# Patient Record
Sex: Female | Born: 1940 | Race: White | Hispanic: No | Smoking: Never smoker
Health system: Southern US, Community
[De-identification: ages and names within clinical notes are randomized; demographics above are authoritative.]

## PROBLEM LIST (undated history)

## (undated) DIAGNOSIS — M1990 Unspecified osteoarthritis, unspecified site: Secondary | ICD-10-CM

## (undated) DIAGNOSIS — I4891 Unspecified atrial fibrillation: Secondary | ICD-10-CM

## (undated) DIAGNOSIS — I10 Essential (primary) hypertension: Secondary | ICD-10-CM

## (undated) DIAGNOSIS — N189 Chronic kidney disease, unspecified: Secondary | ICD-10-CM

## (undated) HISTORY — DX: Chronic kidney disease, unspecified: N18.9

## (undated) HISTORY — DX: Essential (primary) hypertension: I10

## (undated) HISTORY — DX: Unspecified osteoarthritis, unspecified site: M19.90

## (undated) HISTORY — PX: TOTAL HIP ARTHROPLASTY: SHX124

## (undated) HISTORY — DX: Unspecified atrial fibrillation: I48.91

## (undated) HISTORY — PX: JOINT REPLACEMENT: SHX530

## (undated) HISTORY — PX: VAGINAL HYSTERECTOMY: SUR661

## (undated) HISTORY — PX: ABDOMINAL HYSTERECTOMY: SHX81

## (undated) HISTORY — PX: TONSILLECTOMY: SUR1361

---

## 2013-06-20 ENCOUNTER — Emergency Department (INDEPENDENT_AMBULATORY_CARE_PROVIDER_SITE_OTHER)
Admission: EM | Admit: 2013-06-20 | Discharge: 2013-06-20 | Disposition: A | Discharge status: Home / Self Care | Payer: Medicare Other | Source: Home / Self Care | Attending: Family Medicine | Admitting: Family Medicine

## 2013-06-20 ENCOUNTER — Encounter: Payer: Self-pay | Admitting: Emergency Medicine

## 2013-06-20 DIAGNOSIS — J029 Acute pharyngitis, unspecified: Secondary | ICD-10-CM

## 2013-06-20 DIAGNOSIS — J069 Acute upper respiratory infection, unspecified: Secondary | ICD-10-CM

## 2013-06-20 LAB — POCT RAPID STREP A (OFFICE): Rapid Strep A Screen: NEGATIVE

## 2013-06-20 MED ORDER — AZITHROMYCIN 250 MG PO TABS: ORAL_TABLET | ORAL | Status: DC

## 2013-06-20 NOTE — ED Notes (Signed)
Sore throat, hoarse,  cough x 4 days

## 2013-06-20 NOTE — ED Provider Notes (Signed)
CSN: [id number]     Arrival date & time 06/20/13  1137 History   First MD Initiated Contact with Patient 06/20/13 1145     Chief Complaint  Patient presents with  . Sore Throat    HPI  URI Symptoms Onset: 2-3 days  Description: rhinorrhea, nasal congestion, cough, sore throat  Modifying factors:  None   Symptoms Nasal discharge: yes Fever: no Sore throat: yes Cough: yes Wheezing: no Ear pain: no GI symptoms: no Sick contacts: no  Red Flags  Stiff neck: no Dyspnea: no Rash: no Swallowing difficulty: no  Sinusitis Risk Factors Headache/face pain: no Double sickening: no tooth pain: no  Allergy Risk Factors Sneezing: no Itchy scratchy throat: no Seasonal symptoms: no  Flu Risk Factors Headache: no muscle aches: no severe fatigue: no   History reviewed. No pertinent past medical history. Past Surgical History  Procedure Laterality Date  . Tonsillectomy    . Joint replacement     No family history on file. History  Substance Use Topics  . Smoking status: Never Smoker   . Smokeless tobacco: Not on file  . Alcohol Use: No   OB History   Grav Para Term Preterm Abortions TAB SAB Ect Mult Living                 Review of Systems  All other systems reviewed and are negative.    Allergies  Review of patient's allergies indicates no known allergies.  Home Medications   Current Outpatient Rx  Name  Route  Sig  Dispense  Refill  . losartan-hydrochlorothiazide (HYZAAR) 100-12.5 MG per tablet   Oral   Take 1 tablet by mouth daily.         Marland Kitchen azithromycin (ZITHROMAX) 250 MG tablet      Take 2 tabs PO x 1 dose, then 1 tab PO QD x 4 days   6 tablet   0    BP 122/81  Pulse 91  Temp(Src) 98.1 F (36.7 C) (Oral)  Ht 5\' 2"  (1.575 m)  Wt 212 lb (96.163 kg)  BMI 38.77 kg/m2  SpO2 98% Physical Exam  Constitutional: She is oriented to person, place, and time. She appears well-developed and well-nourished.  HENT:  Head: Normocephalic and  atraumatic.  Right Ear: External ear normal.  Left Ear: External ear normal.  +nasal erythema, rhinorrhea bilaterally, + post oropharyngeal erythema    Eyes: Conjunctivae are normal. Pupils are equal, round, and reactive to light.  Neck: Normal range of motion. Neck supple.  Cardiovascular: Normal rate, regular rhythm and normal heart sounds.   Pulmonary/Chest: Effort normal and breath sounds normal.  Abdominal: Soft.  Musculoskeletal: Normal range of motion.  Neurological: She is alert and oriented to person, place, and time.  Skin: Skin is warm.    ED Course  Procedures (including critical care time) Labs Review Labs Reviewed - No data to display Imaging Review No results found.  EKG Interpretation    Date/Time:    Ventricular Rate:    PR Interval:    QRS Duration:   QT Interval:    QTC Calculation:   R Axis:     Text Interpretation:              MDM   1. Acute pharyngitis   2. URI (upper respiratory infection)    Likely viral source of sxs  Rapid strep negative.  Overall reassuring exam Discussed supportive care and infectious/resp red flags.  Zpak if sxs fail to improve/worsen Follow  up as needed.     The patient and/or caregiver has been counseled thoroughly with regard to treatment plan and/or medications prescribed including dosage, schedule, interactions, rationale for use, and possible side effects and they verbalize understanding. Diagnoses and expected course of recovery discussed and will return if not improved as expected or if the condition worsens. Patient and/or caregiver verbalized understanding.         Doree AlbeeSteven Jacqualynn Parco, MD 06/20/13 1204

## 2016-05-12 DIAGNOSIS — R944 Abnormal results of kidney function studies: Secondary | ICD-10-CM | POA: Insufficient documentation

## 2016-05-12 DIAGNOSIS — N1832 Chronic kidney disease, stage 3b: Secondary | ICD-10-CM | POA: Insufficient documentation

## 2016-06-15 DIAGNOSIS — N179 Acute kidney failure, unspecified: Secondary | ICD-10-CM | POA: Insufficient documentation

## 2017-03-28 DIAGNOSIS — N1832 Chronic kidney disease, stage 3b: Secondary | ICD-10-CM | POA: Insufficient documentation

## 2020-04-08 ENCOUNTER — Emergency Department
Admission: EM | Admit: 2020-04-08 | Discharge: 2020-04-08 | Disposition: A | Discharge status: Home / Self Care | Payer: Medicare Other | Source: Home / Self Care | Attending: Family Medicine | Admitting: Family Medicine

## 2020-04-08 ENCOUNTER — Other Ambulatory Visit: Payer: Self-pay

## 2020-04-08 DIAGNOSIS — J069 Acute upper respiratory infection, unspecified: Secondary | ICD-10-CM

## 2020-04-08 MED ORDER — AMOXICILLIN 875 MG PO TABS: 875.0000 mg | ORAL_TABLET | Freq: Two times a day (BID) | ORAL | 0 refills | Status: AC

## 2020-04-08 NOTE — ED Provider Notes (Signed)
[facility]    CSN: [id number] Arrival date & time: 04/08/20  1142      History   Chief Complaint Chief Complaint  Patient presents with  . Cough    HPI Courtney Stevenson is a 79 y.o. female.   Seven days ago patient developed sinus congestion, sore throat, hoarseness, mild headache, and fatigue.  About four days ago she developed a partly productive cough, worse at night.  She believes that she wheezes occasionally.  She denies fevers, chills, and sweats.   She denies chest tightness, shortness of breath, and changes in taste/smell.    The history is provided by the patient.    History reviewed. No pertinent past medical history.  There are no problems to display for this patient.   Past Surgical History:  Procedure Laterality Date  . JOINT REPLACEMENT    . TONSILLECTOMY      OB History   No obstetric history on file.      Home Medications    Prior to Admission medications   Medication Sig Start Date End Date Taking? Authorizing Provider  amLODipine (NORVASC) 10 MG tablet Take 1 tablet by mouth daily. 03/04/20  Yes [provider]  amoxicillin (AMOXIL) 875 MG tablet Take 1 tablet (875 mg total) by mouth 2 (two) times daily for 7 days. 04/08/20 04/15/20  Lattie Haw, MD  losartan-hydrochlorothiazide (HYZAAR) 100-12.5 MG per tablet Take 1 tablet by mouth daily.    [provider]    Family History Family History  Problem Relation Age of Onset  . Diabetes Mother     Social History Social History   Tobacco Use  . Smoking status: Never Smoker  Substance Use Topics  . Alcohol use: No  . Drug use: Not on file     Allergies   Patient has no known allergies.   Review of Systems Review of Systems  + sore throat + hoarse + cough No pleuritic pain ? wheezing + nasal congestion + post-nasal drainage No sinus pain/pressure No itchy/red eyes No earache No hemoptysis No SOB No fever/chills No nausea No  vomiting No abdominal pain No diarrhea No urinary symptoms No skin rash + fatigue No myalgias + headache Used OTC meds (Daquil) without relief    Physical Exam Triage Vital Signs ED Triage Vitals  Enc Vitals Group     BP 04/08/20 1203 (!) 151/97     Pulse Rate 04/08/20 1203 82     Resp 04/08/20 1203 14     Temp 04/08/20 1203 98 F (36.7 C)     Temp Source 04/08/20 1203 Oral     SpO2 04/08/20 1203 95 %     Weight --      Height --      Head Circumference --      Peak Flow --      Pain Score 04/08/20 1159 0     Pain Loc --      Pain Edu? --      Excl. in GC? --    No data found.  Updated Vital Signs BP (!) 151/97 (BP Location: Right Arm)   Pulse 82   Temp 98 F (36.7 C) (Oral)   Resp 14   SpO2 95%   Visual Acuity Right Eye Distance:   Left Eye Distance:   Bilateral Distance:    Right Eye Near:   Left Eye Near:    Bilateral Near:     Physical Exam Nursing notes and Vital Signs reviewed.  Appearance:  Patient appears stated age, and in no acute distress Eyes:  Pupils are equal, round, and reactive to light and accomodation.  Extraocular movement is intact.  Conjunctivae are not inflamed  Ears:  Canals normal.  Tympanic membranes normal.  Nose:  Mildly congested turbinates.  No sinus tenderness.  Pharynx:  Minimal erythema. Neck:  Supple.  Mildly enlarged lateral nodes are present, tender to palpation on the left.   Lungs:  Clear to auscultation.  Breath sounds are equal.  Moving air well. Heart:  Regular rate and rhythm without murmurs, rubs, or gallops.  Abdomen:  Nontender without masses or hepatosplenomegaly.  Bowel sounds are present.  No CVA or flank tenderness.  Extremities:  No edema.  Skin:  No rash present.   UC Treatments / Results  Labs (all labs ordered are listed, but only abnormal results are displayed) Labs Reviewed - No data to display  EKG   Radiology No results found.  Procedures Procedures (including critical care  time)  Medications Ordered in UC Medications - No data to display  Initial Impression / Assessment and Plan / UC Course  I have reviewed the triage vital signs and the nursing notes.  Pertinent labs & imaging results that were available during my care of the patient were reviewed by me and considered in my medical decision making (see chart for details).    There is no evidence of bacterial infection today.  Treat symptomatically for now  Followup with Family Doctor if not improved in about 10 days.   Final Clinical Impressions(s) / UC Diagnoses   Final diagnoses:  Viral URI with cough     Discharge Instructions     Take plain guaifenesin (1200mg  extended release tabs such as Mucinex) twice daily, with plenty of water, for cough and congestion.   Get adequate rest.   May use Afrin nasal spray (or generic oxymetazoline) each morning for about 5 days and then discontinue.  Also recommend using saline nasal spray several times daily and saline nasal irrigation (AYR is a common brand).  Use Flonase nasal spray each morning after using Afrin nasal spray and saline nasal irrigation. Try warm salt water gargles for sore throat.  Stop all antihistamines for now, and other non-prescription cough/cold preparations. May take Delsym Cough Suppressant ("12 Hour Cough Relief") at bedtime for nighttime cough.  Begin Amoxicillin if not improving about one week or if persistent fever develops (Given a prescription to hold, with an expiration date)       ED Prescriptions    Medication Sig Dispense Auth. Provider   amoxicillin (AMOXIL) 875 MG tablet Take 1 tablet (875 mg total) by mouth 2 (two) times daily for 7 days. 14 tablet , MD        Lattie Haw, MD 04/13/20 [phone]

## 2020-04-08 NOTE — Discharge Instructions (Addendum)
Take plain guaifenesin (1200mg  extended release tabs such as Mucinex) twice daily, with plenty of water, for cough and congestion.   Get adequate rest.   May use Afrin nasal spray (or generic oxymetazoline) each morning for about 5 days and then discontinue.  Also recommend using saline nasal spray several times daily and saline nasal irrigation (AYR is a common brand).  Use Flonase nasal spray each morning after using Afrin nasal spray and saline nasal irrigation. Try warm salt water gargles for sore throat.  Stop all antihistamines for now, and other non-prescription cough/cold preparations. May take Delsym Cough Suppressant ("12 Hour Cough Relief") at bedtime for nighttime cough.  Begin Amoxicillin if not improving about one week or if persistent fever develops

## 2020-04-08 NOTE — ED Triage Notes (Signed)
Pt presents with productive cough for 1 week, horse voice, trouble sleeping from coughing. Pt has taken day time cold and flu with some improvement of symptoms.

## 2020-05-20 ENCOUNTER — Emergency Department (INDEPENDENT_AMBULATORY_CARE_PROVIDER_SITE_OTHER): Payer: Medicare Other

## 2020-05-20 ENCOUNTER — Telehealth: Payer: Self-pay | Admitting: Emergency Medicine

## 2020-05-20 ENCOUNTER — Other Ambulatory Visit: Payer: Self-pay

## 2020-05-20 ENCOUNTER — Emergency Department
Admission: EM | Admit: 2020-05-20 | Discharge: 2020-05-20 | Disposition: A | Discharge status: Home / Self Care | Payer: Medicare Other | Source: Home / Self Care

## 2020-05-20 DIAGNOSIS — J189 Pneumonia, unspecified organism: Secondary | ICD-10-CM | POA: Diagnosis not present

## 2020-05-20 DIAGNOSIS — J168 Pneumonia due to other specified infectious organisms: Secondary | ICD-10-CM

## 2020-05-20 MED ORDER — ALBUTEROL SULFATE HFA 108 (90 BASE) MCG/ACT IN AERS: 2.0000 | INHALATION_SPRAY | RESPIRATORY_TRACT | 0 refills | Status: DC | PRN

## 2020-05-20 MED ORDER — HYDROCODONE-HOMATROPINE 5-1.5 MG/5ML PO SYRP: 5.0000 mL | ORAL_SOLUTION | Freq: Four times a day (QID) | ORAL | 0 refills | Status: DC | PRN

## 2020-05-20 MED ORDER — LEVOFLOXACIN 500 MG PO TABS: 500.0000 mg | ORAL_TABLET | Freq: Every day | ORAL | 0 refills | Status: DC

## 2020-05-20 MED ORDER — DEXAMETHASONE 4 MG PO TABS: 4.0000 mg | ORAL_TABLET | Freq: Two times a day (BID) | ORAL | 0 refills | Status: DC

## 2020-05-20 NOTE — Telephone Encounter (Signed)
Patient's daughter called to request an inhaler for her mother's intermittent mild SOB; consulted with Dr.S. Hyacinth Meeker and he approves albuterol inhaler 2 puffs prn q 4 hours for mild SOB; encouraged them to take patient to ER if worsening rather than improving and daughter agrees to do so.

## 2020-05-20 NOTE — Discharge Instructions (Addendum)
The x-ray has many of the features of Covid.  You are going to need to follow-up in 1 week.  Please either return here or see your private physician.

## 2020-05-20 NOTE — ED Provider Notes (Signed)
[facility]    CSN: [id number] Arrival date & time: 05/20/20  1108      History   Chief Complaint Chief Complaint  Patient presents with  . Cough    HPI Courtney Stevenson is a 79 y.o. female.   Courtney Stevenson urgent care patient   Pt c/o cough since before Thanksgiving.  Was seen here in UC on 11/17. Rx'd amoxicillin, improvement after course. Cough returned one week ago. Denies fever. Fatigued from cough and hoarse x 3 weeks.  Patient is just not improving.     History reviewed. No pertinent past medical history.  There are no problems to display for this patient.   Past Surgical History:  Procedure Laterality Date  . JOINT REPLACEMENT    . TONSILLECTOMY      OB History   No obstetric history on file.      Home Medications    Prior to Admission medications   Medication Sig Start Date End Date Taking? Authorizing Provider  dexamethasone (DECADRON) 4 MG tablet Take 1 tablet (4 mg total) by mouth 2 (two) times daily with a meal. 05/20/20  Yes Elvina Sidle, MD  HYDROcodone-homatropine (HYDROMET) 5-1.5 MG/5ML syrup Take 5 mLs by mouth every 6 (six) hours as needed for cough. 05/20/20  Yes Elvina Sidle, MD  levofloxacin (LEVAQUIN) 500 MG tablet Take 1 tablet (500 mg total) by mouth daily. 05/20/20  Yes Elvina Sidle, MD  amLODipine (NORVASC) 10 MG tablet Take 1 tablet by mouth daily. 03/04/20   [provider]  losartan-hydrochlorothiazide (HYZAAR) 100-12.5 MG per tablet Take 1 tablet by mouth daily.  04/13/20  [provider]    Family History Family History  Problem Relation Age of Onset  . Diabetes Mother     Social History Social History   Tobacco Use  . Smoking status: Never Smoker  Vaping Use  . Vaping Use: Never used  Substance Use Topics  . Alcohol use: No     Allergies   Patient has no known allergies.   Review of Systems Review of Systems  Constitutional: Positive for fatigue. Negative  for fever.  HENT: Positive for congestion.   Respiratory: Positive for cough.      Physical Exam Triage Vital Signs ED Triage Vitals [05/20/20 1137]  Enc Vitals Group     BP (!) 152/81     Pulse Rate 95     Resp 19     Temp 98.1 F (36.7 C)     Temp Source Oral     SpO2 93 %     Weight      Height      Head Circumference      Peak Flow      Pain Score      Pain Loc      Pain Edu?      Excl. in GC?    No data found.  Updated Vital Signs BP (!) 152/81 (BP Location: Right Arm)   Pulse 95   Temp 98.1 F (36.7 C) (Oral)   Resp 19   SpO2 93%    Physical Exam Vitals and nursing note reviewed.  Constitutional:      General: She is not in acute distress.    Appearance: She is obese. She is ill-appearing.  HENT:     Head: Normocephalic.     Nose: Nose normal.  Eyes:     Conjunctiva/sclera: Conjunctivae normal.  Cardiovascular:     Rate and Rhythm: Normal rate.  Pulmonary:  Effort: Pulmonary effort is normal.     Breath sounds: Rales present.     Comments: Rales in the left chest diffusely both anteriorly and posteriorly with decreased breath sounds at the base Musculoskeletal:        General: Normal range of motion.     Cervical back: Normal range of motion and neck supple.  Skin:    General: Skin is warm.     Findings: Bruising present.     Comments: Patient has sutures in the back of her neck with diffuse ecchymosis  Neurological:     General: No focal deficit present.     Mental Status: She is alert.  Psychiatric:        Mood and Affect: Mood normal.        Behavior: Behavior normal.      UC Treatments / Results  Labs (all labs ordered are listed, but only abnormal results are displayed) Labs Reviewed - No data to display  EKG   Radiology DG Chest 2 View  Result Date: 05/20/2020 CLINICAL DATA:  Cough EXAM: CHEST - 2 VIEW COMPARISON:  None. FINDINGS: There is elevation of the left hemidiaphragm. There is patchy airspace opacity throughout  much of the left lung, most notably in the left lower lung region. There is mild right base atelectasis. Heart size and pulmonary vascularity are normal. No adenopathy. No bone lesions. IMPRESSION: Elevation of left hemidiaphragm. Airspace opacity at several sites in the left lung, most notably in the left lower lung region. Appearance consistent with multifocal pneumonia. Question atypical organism pneumonia. Advise check of COVID-19 status given this appearance. Right lung clear except for mild right base atelectasis. Heart size within normal limits. No adenopathy evident. These results will be called to the ordering clinician or representative by the Radiologist Assistant, and communication documented in the PACS or [employer]. Electronically Signed   By: Bretta Bang III M.D.   On: 05/20/2020 12:00    Procedures Procedures (including critical care time)  Medications Ordered in UC Medications - No data to display  Initial Impression / Assessment and Plan / UC Course  I have reviewed the triage vital signs and the nursing notes.  Pertinent labs & imaging results that were available during my care of the patient were reviewed by me and considered in my medical decision making (see chart for details).    Final Clinical Impressions(s) / UC Diagnoses   Final diagnoses:  Pneumonia of both lungs due to infectious organism, unspecified part of lung     Discharge Instructions     The x-ray has many of the features of Covid.  You are going to need to follow-up in 1 week.  Please either return here or see your private physician.    ED Prescriptions    Medication Sig Dispense Auth. Provider   levofloxacin (LEVAQUIN) 500 MG tablet Take 1 tablet (500 mg total) by mouth daily. 10 tablet Elvina Sidle, MD   HYDROcodone-homatropine (HYDROMET) 5-1.5 MG/5ML syrup Take 5 mLs by mouth every 6 (six) hours as needed for cough. 60 mL Elvina Sidle, MD   dexamethasone (DECADRON) 4 MG  tablet Take 1 tablet (4 mg total) by mouth 2 (two) times daily with a meal. 10 tablet Elvina Sidle, MD     I have reviewed the PDMP during this encounter.   Elvina Sidle, MD 05/20/20 1401

## 2020-05-20 NOTE — ED Triage Notes (Signed)
Pt c/o cough since before Thanksgiving.  Was seen here in UC on 11/17. Rx'd amoxicillin, improvement after course. Cough returned one week ago. Denies fever. Fatigued from cough and hoarse x 3 weeks.

## 2020-05-28 ENCOUNTER — Emergency Department (INDEPENDENT_AMBULATORY_CARE_PROVIDER_SITE_OTHER)
Admission: EM | Admit: 2020-05-28 | Discharge: 2020-05-28 | Disposition: A | Discharge status: Home / Self Care | Payer: Medicare Other | Source: Home / Self Care

## 2020-05-28 ENCOUNTER — Other Ambulatory Visit: Payer: Self-pay

## 2020-05-28 ENCOUNTER — Emergency Department (INDEPENDENT_AMBULATORY_CARE_PROVIDER_SITE_OTHER): Payer: Medicare Other

## 2020-05-28 DIAGNOSIS — J168 Pneumonia due to other specified infectious organisms: Secondary | ICD-10-CM | POA: Diagnosis not present

## 2020-05-28 DIAGNOSIS — J189 Pneumonia, unspecified organism: Secondary | ICD-10-CM | POA: Diagnosis not present

## 2020-05-28 DIAGNOSIS — B370 Candidal stomatitis: Secondary | ICD-10-CM

## 2020-05-28 MED ORDER — CLOTRIMAZOLE 10 MG MT TROC: 10.0000 mg | Freq: Every day | OROMUCOSAL | 0 refills | Status: AC

## 2020-05-28 NOTE — Discharge Instructions (Addendum)
°  Your pneumonia appears to be resolving, which is good. Be sure to complete the entire course of your antibiotic. Your cough still may linger for a few days to weeks. Continue to rest and stay hydrated.  Follow up next week with primary care if not continuing to improve.   Call 911 or have someone drive you to the hospital if symptoms significantly worsening.

## 2020-05-28 NOTE — ED Triage Notes (Signed)
Patient presents to Urgent Care with complaints of needing a follow-up CXR since she was diagnosed w/ pneumonia last week. Patient reports she was told to return to have imaging redone to ensure the infection had cleared up, reports feeling a lot better, is still taking the rest of her antibiotic.

## 2020-05-28 NOTE — ED Provider Notes (Signed)
[facility]    CSN: [id number] Arrival date & time: 05/28/20  1248      History   Chief Complaint Chief Complaint  Patient presents with  . Appointment    1:00  . Follow-up    After pneumonia    HPI Courtney Stevenson is a 80 y.o. female.   HPI  Courtney Stevenson is a 79 y.o. female presenting to UC with request for a follow up chest x-ray after being dx with pneumonia last week. She has one more day of her antibiotic, levaquin.  States she feels like she is improving each day. Still using her inhaler daily but denies SOB at this time. She does report having some mouth irritation and dryness. Denies fever, chills, n/v/d.    History reviewed. No pertinent past medical history.  There are no problems to display for this patient.   Past Surgical History:  Procedure Laterality Date  . JOINT REPLACEMENT    . TONSILLECTOMY      OB History   No obstetric history on file.      Home Medications    Prior to Admission medications   Medication Sig Start Date End Date Taking? Authorizing Provider  clotrimazole (MYCELEX) 10 MG troche Take 1 tablet (10 mg total) by mouth 5 (five) times daily for 7 days. 05/28/20 06/04/20 Yes Eligah Anello O, PA-C  albuterol (VENTOLIN HFA) 108 (90 Base) MCG/ACT inhaler Inhale 2 puffs into the lungs every 4 (four) hours as needed for wheezing or shortness of breath. 05/20/20   Frederica Kuster, MD  amLODipine (NORVASC) 10 MG tablet Take 1 tablet by mouth daily. 03/04/20   [provider]  dexamethasone (DECADRON) 4 MG tablet Take 1 tablet (4 mg total) by mouth 2 (two) times daily with a meal. 05/20/20   Elvina Sidle, MD  HYDROcodone-homatropine (HYDROMET) 5-1.5 MG/5ML syrup Take 5 mLs by mouth every 6 (six) hours as needed for cough. 05/20/20   Elvina Sidle, MD  levofloxacin (LEVAQUIN) 500 MG tablet Take 1 tablet (500 mg total) by mouth daily. 05/20/20   Elvina Sidle, MD  losartan-hydrochlorothiazide (HYZAAR) 100-12.5 MG per  tablet Take 1 tablet by mouth daily.  04/13/20  [provider]    Family History Family History  Problem Relation Age of Onset  . Diabetes Mother     Social History Social History   Tobacco Use  . Smoking status: Never Smoker  . Smokeless tobacco: Never Used  Vaping Use  . Vaping Use: Never used  Substance Use Topics  . Alcohol use: No     Allergies   Patient has no known allergies.   Review of Systems Review of Systems  Constitutional: Negative for chills and fever.  HENT: Positive for congestion, mouth sores (irritation and dryness) and sore throat (mild irritation). Negative for ear pain, trouble swallowing and voice change.   Respiratory: Positive for cough. Negative for shortness of breath.   Cardiovascular: Negative for chest pain and palpitations.  Gastrointestinal: Negative for abdominal pain, diarrhea, nausea and vomiting.  Musculoskeletal: Negative for arthralgias, back pain and myalgias.  Skin: Negative for rash.  All other systems reviewed and are negative.    Physical Exam Triage Vital Signs ED Triage Vitals  Enc Vitals Group     BP 05/28/20 1333 (!) 154/89     Pulse Rate 05/28/20 1333 96     Resp 05/28/20 1333 16     Temp 05/28/20 1333 97.8 F (36.6 C)     Temp Source 05/28/20  1333 Oral     SpO2 05/28/20 1333 98 %     Weight --      Height --      Head Circumference --      Peak Flow --      Pain Score 05/28/20 1332 0     Pain Loc --      Pain Edu? --      Excl. in [place]? --    No data found.  Updated Vital Signs BP (!) 154/89 (BP Location: Right Arm)   Pulse 96   Temp 97.8 F (36.6 C) (Oral)   Resp 16   SpO2 98%   Visual Acuity Right Eye Distance:   Left Eye Distance:   Bilateral Distance:    Right Eye Near:   Left Eye Near:    Bilateral Near:     Physical Exam Vitals and nursing note reviewed.  Constitutional:      General: She is not in acute distress.    Appearance: Normal appearance. She is well-developed and  well-nourished. She is not ill-appearing, toxic-appearing or diaphoretic.  HENT:     Head: Normocephalic and atraumatic.     Right Ear: Tympanic membrane and ear canal normal.     Left Ear: Tympanic membrane and ear canal normal.     Nose: Nose normal.     Right Sinus: No maxillary sinus tenderness or frontal sinus tenderness.     Left Sinus: No maxillary sinus tenderness or frontal sinus tenderness.     Mouth/Throat:     Lips: Pink.     Mouth: Mucous membranes are dry.     Pharynx: Oropharynx is clear. Uvula midline. No pharyngeal swelling, oropharyngeal exudate, posterior oropharyngeal erythema or uvula swelling.   Eyes:     Extraocular Movements: EOM normal.  Cardiovascular:     Rate and Rhythm: Normal rate and regular rhythm.  Pulmonary:     Effort: Pulmonary effort is normal. No respiratory distress.     Breath sounds: No stridor. Rhonchi (faint in lower lung fields) present. No wheezing or rales.  Musculoskeletal:        General: Normal range of motion.     Cervical back: Normal range of motion and neck supple.  Skin:    General: Skin is warm and dry.  Neurological:     Mental Status: She is alert and oriented to person, place, and time.  Psychiatric:        Mood and Affect: Mood and affect normal.        Behavior: Behavior normal.      UC Treatments / Results  Labs (all labs ordered are listed, but only abnormal results are displayed) Labs Reviewed - No data to display  EKG   Radiology DG Chest 2 View  Result Date: 05/28/2020 CLINICAL DATA:  Follow-up pneumonia EXAM: CHEST - 2 VIEW COMPARISON:  05/20/2020 FINDINGS: Significant improvement in left lower lobe infiltrate. Elevated left hemidiaphragm unchanged with mild left lower lobe atelectasis. No effusion. Right lung remains clear.  Negative for heart failure. IMPRESSION: Clearing left lower lobe infiltrate. Electronically Signed   By: Franchot Gallo M.D.   On: 05/28/2020 14:19    Procedures Procedures  (including critical care time)  Medications Ordered in UC Medications - No data to display  Initial Impression / Assessment and Plan / UC Course  I have reviewed the triage vital signs and the nursing notes.  Pertinent labs & imaging results that were available during my care of the patient were  reviewed by me and considered in my medical decision making (see chart for details).     Reassured pt of resolving pneumonia Complete antibiotic Will tx for oral thrush F/u with PCP next week as needed AVS given  Final Clinical Impressions(s) / UC Diagnoses   Final diagnoses:  Pneumonia of left lower lobe due to infectious organism  Oral thrush     Discharge Instructions      Your pneumonia appears to be resolving, which is good. Be sure to complete the entire course of your antibiotic. Your cough still may linger for a few days to weeks. Continue to rest and stay hydrated.  Follow up next week with primary care if not continuing to improve.   Call 911 or have someone drive you to the hospital if symptoms significantly worsening.     ED Prescriptions    Medication Sig Dispense Auth. Provider   clotrimazole (MYCELEX) 10 MG troche Take 1 tablet (10 mg total) by mouth 5 (five) times daily for 7 days. 35 tablet Lurene Shadow, [place]     PDMP not reviewed this encounter.   Lurene Shadow, [place] 05/28/20 1433

## 2020-06-22 ENCOUNTER — Other Ambulatory Visit: Payer: Self-pay

## 2020-06-22 ENCOUNTER — Emergency Department
Admission: EM | Admit: 2020-06-22 | Discharge: 2020-06-22 | Disposition: A | Discharge status: Home / Self Care | Payer: Medicare Other | Source: Home / Self Care

## 2020-06-22 DIAGNOSIS — I4891 Unspecified atrial fibrillation: Secondary | ICD-10-CM

## 2020-06-22 DIAGNOSIS — R059 Cough, unspecified: Secondary | ICD-10-CM

## 2020-06-22 NOTE — ED Notes (Signed)
Patient is being discharged from the Urgent Care and sent to the Emergency Department via POV driven by husband. Per Waylan Rocher PAC, patient is in need of higher level of care due to abnormal ekg (new onset afib). Patient is aware and verbalizes understanding of plan of care.  Vitals:   06/22/20 1449 06/22/20 1514  BP: (!) 171/91   Pulse: (!) 110 94  Resp: 17   Temp: 97.9 F (36.6 C)   SpO2: 95%

## 2020-06-22 NOTE — ED Provider Notes (Signed)
[facility]    CSN: [id number] Arrival date & time: 06/22/20  1430      History   Chief Complaint Chief Complaint  Patient presents with  . Cough    HPI Courtney Stevenson is a 80 y.o. female.   HPI Courtney Stevenson is a 80 y.o. female presenting to UC with c/o continued cough since follow up visit on 05/28/20.  Pt was seen initially on 04/08/20 for a viral cough for 4 days, was tx with amoxicillin. Cough did improve temporarily but cough returned again around Christmas. She was seen 05/20/20, had a CT chest concerning for bilateral pneumonia, possible COVID pneumonia, was tx with levaquin and decadron. She returned to [facility] on 05/28/20 for a repeat CXR, which showed "significant improvement of left lower lobe infiltrate."  Pt states cough has nearly resolved but is worse at night, kept her up last night/early morning. Denies fever, chills, chest pain, SOB or palpitations. She was never formally tested for COVID. No hx of asthma or COPD. Pt's PCP only offers Evisits.    History reviewed. No pertinent past medical history.  There are no problems to display for this patient.   Past Surgical History:  Procedure Laterality Date  . JOINT REPLACEMENT    . TONSILLECTOMY      OB History   No obstetric history on file.      Home Medications    Prior to Admission medications   Medication Sig Start Date End Date Taking? Authorizing Provider  albuterol (VENTOLIN HFA) 108 (90 Base) MCG/ACT inhaler Inhale 2 puffs into the lungs every 4 (four) hours as needed for wheezing or shortness of breath. 05/20/20   Frederica Kuster, MD  amLODipine (NORVASC) 10 MG tablet Take 1 tablet by mouth daily. 03/04/20   [provider]  dexamethasone (DECADRON) 4 MG tablet Take 1 tablet (4 mg total) by mouth 2 (two) times daily with a meal. 05/20/20   Elvina Sidle, MD  HYDROcodone-homatropine (HYDROMET) 5-1.5 MG/5ML syrup Take 5 mLs by mouth every 6 (six) hours as needed for cough. 05/20/20    Elvina Sidle, MD  levofloxacin (LEVAQUIN) 500 MG tablet Take 1 tablet (500 mg total) by mouth daily. 05/20/20   Elvina Sidle, MD  losartan-hydrochlorothiazide (HYZAAR) 100-12.5 MG per tablet Take 1 tablet by mouth daily.  04/13/20  [provider]    Family History Family History  Problem Relation Age of Onset  . Diabetes Mother     Social History Social History   Tobacco Use  . Smoking status: Never Smoker  . Smokeless tobacco: Never Used  Vaping Use  . Vaping Use: Never used  Substance Use Topics  . Alcohol use: No     Allergies   Patient has no known allergies.   Review of Systems Review of Systems  Constitutional: Negative for chills and fever.  HENT: Positive for congestion (mild). Negative for ear pain, sore throat, trouble swallowing and voice change.   Respiratory: Positive for cough. Negative for shortness of breath.   Cardiovascular: Negative for chest pain and palpitations.  Gastrointestinal: Negative for abdominal pain, diarrhea, nausea and vomiting.  Musculoskeletal: Negative for arthralgias, back pain and myalgias.  Skin: Negative for rash.  All other systems reviewed and are negative.    Physical Exam Triage Vital Signs ED Triage Vitals  Enc Vitals Group     BP 06/22/20 1449 (!) 171/91     Pulse Rate 06/22/20 1449 (!) 110     Resp 06/22/20 1449 17  Temp 06/22/20 1449 97.9 F (36.6 C)     Temp Source 06/22/20 1449 Oral     SpO2 06/22/20 1449 95 %     Weight --      Height --      Head Circumference --      Peak Flow --      Pain Score 06/22/20 1447 0     Pain Loc --      Pain Edu? --      Excl. in [place]? --    No data found.  Updated Vital Signs BP (!) 171/91 (BP Location: Right Arm)   Pulse 94   Temp 97.9 F (36.6 C) (Oral)   Resp 17   SpO2 95%   Visual Acuity Right Eye Distance:   Left Eye Distance:   Bilateral Distance:    Right Eye Near:   Left Eye Near:    Bilateral Near:     Physical Exam Vitals and  nursing note reviewed.  Constitutional:      Appearance: Normal appearance. She is well-developed and well-nourished.  HENT:     Head: Normocephalic and atraumatic.     Right Ear: Tympanic membrane and ear canal normal.     Left Ear: Tympanic membrane and ear canal normal.     Nose: Nose normal.     Right Sinus: No maxillary sinus tenderness or frontal sinus tenderness.     Left Sinus: No maxillary sinus tenderness or frontal sinus tenderness.     Mouth/Throat:     Lips: Pink.     Mouth: Mucous membranes are moist.     Pharynx: Oropharynx is clear. Uvula midline.  Eyes:     Extraocular Movements: EOM normal.  Cardiovascular:     Rate and Rhythm: Normal rate. Rhythm irregular.  Pulmonary:     Effort: Pulmonary effort is normal. No respiratory distress.     Breath sounds: Normal breath sounds. No stridor. No wheezing, rhonchi or rales.  Musculoskeletal:        General: Normal range of motion.     Cervical back: Normal range of motion.  Skin:    General: Skin is warm and dry.  Neurological:     Mental Status: She is alert and oriented to person, place, and time.  Psychiatric:        Mood and Affect: Mood and affect normal.        Behavior: Behavior normal.      UC Treatments / Results  Labs (all labs ordered are listed, but only abnormal results are displayed) Labs Reviewed - No data to display  EKG Date/Time:06/22/2020    15:30:04 Ventricular Rate: 99 PR Interval: * QRS Duration: 78 QT Interval: 342 QTC Calculation: 438 P-R-T axes: *   4    53 Text Interpretation: Atrial fibrillation, abnormal ECG  EKG performed one minute before, similar with Vent. Rate of 107, otherwise, no prior EKG to compare.   Radiology No results found.  Procedures Procedures (including critical care time)  Medications Ordered in UC Medications - No data to display  Initial Impression / Assessment and Plan / UC Course  I have reviewed the triage vital signs and the nursing  notes.  Pertinent labs & imaging results that were available during my care of the patient were reviewed by me and considered in my medical decision making (see chart for details).     Pt c/o continued intermittent nagging cough. No fever, chest pain, SOB or palpitations. Irregular rhythm noted on exam. EKG:  atrial fibrillation Pt denies hx of prior Advised to go to emergency department for further evaluation and treatment of new onset a-fib. Pt declined EMS transport. Pt's husband to drive her to [facility] Emergency Department. Pt escorted to car by nursing staff but was able to ambulate without assistance.   Final Clinical Impressions(s) / UC Diagnoses   Final diagnoses:  New onset a-fib (HCC)  Cough     Discharge Instructions      Your heart is in an abnormal rhythm and because this is new for you, it is advised you are evaluated and treated in the emergency department this afternoon. You have declined EMS transport but it is important you have your husband drive you directly to the emergency department. No not stop along the way. If left untreated, you could produce blood clots that can lead to a stroke, heart attack or even death.  Your heart can also go into a worsening abnormal rhythm and cause death.     ED Prescriptions    None     PDMP not reviewed this encounter.   Lurene Shadow, [place] 06/22/20 1556

## 2020-06-22 NOTE — Discharge Instructions (Signed)
  Your heart is in an abnormal rhythm and because this is new for you, it is advised you are evaluated and treated in the emergency department this afternoon. You have declined EMS transport but it is important you have your husband drive you directly to the emergency department. No not stop along the way. If left untreated, you could produce blood clots that can lead to a stroke, heart attack or even death.  Your heart can also go into a worsening abnormal rhythm and cause death.

## 2020-06-22 NOTE — ED Triage Notes (Signed)
Pt c/o continued cough since follow up visit on Jan 6 for double pnuemonia. Says some days she feels better for a few days then comes back. Albuterol prn.  Pt is hypertensive today, states she hasnt taken her BP meds today.

## 2020-06-23 ENCOUNTER — Telehealth: Payer: Self-pay | Admitting: Emergency Medicine

## 2020-06-23 DIAGNOSIS — E039 Hypothyroidism, unspecified: Secondary | ICD-10-CM | POA: Insufficient documentation

## 2020-06-23 DIAGNOSIS — I10 Essential (primary) hypertension: Secondary | ICD-10-CM | POA: Insufficient documentation

## 2020-06-23 NOTE — Telephone Encounter (Signed)
Call from Tereza's daughter regarding increased swelling in BLE. Okay to speak w/ family member per pt. Arieal was discharged from the hospital [facility]) today - started on eliquis. PCP refuses to see pt at this time per family - pt tested positive for COVID in ER at [facility]- pt was diagnosed w/ COVID pneumonia  approx 3 weeks ago & is no longer contagious. [facility] discharge note reviewed by Jerrilyn Cairo, NP provider here today. Pt's family stated they should bring pt back to urgent Care for lasix. RN explained to family that lasix was not administered in the Urgent Care & patient would need to return to ER for further treatment per provider here today.

## 2020-07-08 ENCOUNTER — Other Ambulatory Visit: Payer: Self-pay

## 2020-07-08 ENCOUNTER — Ambulatory Visit (INDEPENDENT_AMBULATORY_CARE_PROVIDER_SITE_OTHER): Payer: Medicare Other | Admitting: Medical-Surgical

## 2020-07-08 ENCOUNTER — Ambulatory Visit (INDEPENDENT_AMBULATORY_CARE_PROVIDER_SITE_OTHER): Payer: Medicare Other

## 2020-07-08 ENCOUNTER — Encounter: Payer: Self-pay | Admitting: Medical-Surgical

## 2020-07-08 VITALS — BP 131/66 | HR 86 | Temp 97.5°F | Ht 61.0 in | Wt 220.9 lb

## 2020-07-08 DIAGNOSIS — I10 Essential (primary) hypertension: Secondary | ICD-10-CM | POA: Diagnosis not present

## 2020-07-08 DIAGNOSIS — R600 Localized edema: Secondary | ICD-10-CM

## 2020-07-08 DIAGNOSIS — I4891 Unspecified atrial fibrillation: Secondary | ICD-10-CM | POA: Diagnosis not present

## 2020-07-08 DIAGNOSIS — R0600 Dyspnea, unspecified: Secondary | ICD-10-CM

## 2020-07-08 DIAGNOSIS — Z7689 Persons encountering health services in other specified circumstances: Secondary | ICD-10-CM

## 2020-07-08 DIAGNOSIS — N1832 Chronic kidney disease, stage 3b: Secondary | ICD-10-CM

## 2020-07-08 DIAGNOSIS — Z13820 Encounter for screening for osteoporosis: Secondary | ICD-10-CM

## 2020-07-08 DIAGNOSIS — R0602 Shortness of breath: Secondary | ICD-10-CM

## 2020-07-08 DIAGNOSIS — R7989 Other specified abnormal findings of blood chemistry: Secondary | ICD-10-CM

## 2020-07-08 MED ORDER — FUROSEMIDE 40 MG PO TABS: 40.0000 mg | ORAL_TABLET | Freq: Two times a day (BID) | ORAL | 0 refills | Status: DC

## 2020-07-08 NOTE — Progress Notes (Signed)
New Patient Office Visit  Subjective:  Patient ID: Courtney Stevenson, female    DOB: 07/09/1940  Age: 80 y.o. MRN: [id number]  CC:  Chief Complaint  Patient presents with  . Establish Care    HPI Courtney Stevenson presents to establish care.   She was recently evaluated at the ED for bilateral pneumonia after having COVID 19. She is with her daughter today who helps to provide history. They do have questions regarding her blood work while at the hospital as they were not provided with that information at discharge.   Afib- had an EKG done recently which showed atrial fibrillation. Was started on diltiazem and has been taking that as prescribed, tolerating well without side effects.   BLE edema- has had lower extremity edema for approximately 1 year. Wears compression stockings but the swelling has gotten so bad that she can no longer get the stockings above her ankles. Swelling improves with elevation but doesn't resolve. Eating a low sodium diet. Has noted serous fluid weeping from the lateral right calf at times.   Dyspnea- has had shortness of breath on a daily basis since she had COVID but this has been worse lately, especially on exertion. SOB limiting activity tolerance greatly.   CKD stage 3b- was not aware that her kidney function was reduced with a GFR of 37 in the hospital. Family history of kidney disease in both parents with her father passing away in his 39s and her mother having to do dialysis later in life. Avoids ibuprofen/NSAIDs.   HTN- taking Amlodipine 10mg  daily for many years, tolerating well without significant side effects.   Elevated TSH- TSH at 6.09 with normal T4 2 weeks ago. No prior history of thyroid disease.   History reviewed. No pertinent past medical history.  Past Surgical History:  Procedure Laterality Date  . JOINT REPLACEMENT    . TONSILLECTOMY      Family History  Problem Relation Age of Onset  . Diabetes Mother     Social History    Socioeconomic History  . Marital status: Married    Spouse name: Not on file  . Number of children: Not on file  . Years of education: Not on file  . Highest education level: Not on file  Occupational History  . Not on file  Tobacco Use  . Smoking status: Never Smoker  . Smokeless tobacco: Never Used  Vaping Use  . Vaping Use: Never used  Substance and Sexual Activity  . Alcohol use: Never  . Drug use: Never  . Sexual activity: Not Currently    Birth control/protection: Post-menopausal  Other Topics Concern  . Not on file  Social History Narrative  . Not on file   Social Determinants of Health   Financial Resource Strain: Not on file  Food Insecurity: Not on file  Transportation Needs: Not on file  Physical Activity: Not on file  Stress: Not on file  Social Connections: Not on file  Intimate Partner Violence: Not on file    ROS Review of Systems  Constitutional: Positive for fatigue and unexpected weight change. Negative for chills and fever.  Respiratory: Positive for cough and shortness of breath. Negative for chest tightness and wheezing.   Cardiovascular: Positive for leg swelling. Negative for chest pain and palpitations.  Endocrine: Negative for cold intolerance, heat intolerance, polydipsia, polyphagia and polyuria.  Genitourinary: Negative for dysuria, frequency and urgency.  Neurological: Negative for dizziness, light-headedness and headaches.  Psychiatric/Behavioral: Negative for dysphoric mood, self-injury, sleep  disturbance and suicidal ideas. The patient is not nervous/anxious.     Objective:   Today's Vitals: BP 131/66   Pulse 86   Temp (!) 97.5 F (36.4 C)   Ht 5\' 1"  (1.549 m)   Wt 220 lb 14.4 oz (100.2 kg)   SpO2 95%   BMI 41.74 kg/m   Physical Exam Vitals reviewed.  Constitutional:      General: She is not in acute distress.    Appearance: Normal appearance. She is obese.  HENT:     Head: Normocephalic and atraumatic.   Cardiovascular:     Rate and Rhythm: Normal rate and regular rhythm.     Pulses: Normal pulses.     Heart sounds: Murmur heard.   Systolic murmur is present with a grade of 2/6. No friction rub. No gallop.      Comments: EKG in office- normal sinus rhythm, normal rate, normal axis, no acute changes Pulmonary:     Effort: Pulmonary effort is normal. No respiratory distress.     Comments: Fine crackles to bilateral bases posteriorly. Musculoskeletal:     Right lower leg: Edema (serous drainage) present.     Left lower leg: Edema present.  Skin:    General: Skin is warm and dry.     Findings: Erythema (right lateral lower leg) present.  Neurological:     Mental Status: She is alert and oriented to person, place, and time.  Psychiatric:        Mood and Affect: Mood normal.        Behavior: Behavior normal.        Thought Content: Thought content normal.        Judgment: Judgment normal.     Assessment & Plan:   1. Encounter to establish care Reviewed available information and discussed hospital labs with patient and her daughter.   2. Atrial fibrillation, unspecified type (HCC) EKG completed with normal rate, axis, and rhythm. Continue Diltiazem as prescribed.  - EKG 12-Lead  3. Essential hypertension Checking CBC w/diff and CMP. Continue Amlodipine as prescribed. Although lower extremity edema may be a side effect of Amlodipine, unclear if this is a contributing factor or merely coincidental in the setting of volume overload. - CBC with Differential/Platelet - COMPLETE METABOLIC PANEL WITH GFR  4. Bilateral lower extremity edema Checking BNP. Concern for volume overload vs. CHF. Although she has some kidney dysfunction, her excess fluid volume has reached a point where intervention has to occur. Furosemide 40mg  BID x 7 days. Legs wrapped in office with kerlix and ACE wraps. Advised patient to elevated lower extremities as much as possible to prevent dependent edema.  - B Nat  Peptide  5. Shortness of breath Chest x-ray today. Checking BNP.  - DG Chest 2 View; Future  6. Stage 3b chronic kidney disease (HCC) Discussed CKD staging. Advised to avoid nephrotoxic medications. Creatinine may increase with a reduction in GFR with lasix but this should be transient.   7. Elevated TSH Rechecking TSH.  - TSH  8. Screening for osteoporosis Overdue for bone density testing. Ordering DEXA scan today for completion in the next few months.  - DG Bone Density; Future   Outpatient Encounter Medications as of 07/08/2020  Medication Sig  . amLODipine (NORVASC) 10 MG tablet Take 1 tablet by mouth daily.  diltiazem (CARDIZEM CD) 240 MG 24 hr capsule Take 240 mg by mouth daily.  . furosemide (LASIX) 40 MG tablet Take 1 tablet (40 mg total) by mouth  2 (two) times daily.  . [DISCONTINUED] albuterol (VENTOLIN HFA) 108 (90 Base) MCG/ACT inhaler Inhale 2 puffs into the lungs every 4 (four) hours as needed for wheezing or shortness of breath.  . [DISCONTINUED] dexamethasone (DECADRON) 4 MG tablet Take 1 tablet (4 mg total) by mouth 2 (two) times daily with a meal.  . [DISCONTINUED] HYDROcodone-homatropine (HYDROMET) 5-1.5 MG/5ML syrup Take 5 mLs by mouth every 6 (six) hours as needed for cough.  . [DISCONTINUED] levofloxacin (LEVAQUIN) 500 MG tablet Take 1 tablet (500 mg total) by mouth daily.  . [DISCONTINUED] losartan-hydrochlorothiazide (HYZAAR) 100-12.5 MG per tablet Take 1 tablet by mouth daily.   No facility-administered encounter medications on file as of 07/08/2020.    Follow-up: Return in about 1 week (around 07/15/2020) for swelling follow up.   Thayer Ohm, DNP, APRN, FNP-BC [facility]

## 2020-07-09 LAB — COMPLETE METABOLIC PANEL WITH GFR
AG Ratio: 1.6 (calc) (ref 1.0–2.5)
ALT: 22 U/L (ref 6–29)
AST: 17 U/L (ref 10–35)
Albumin: 3.9 g/dL (ref 3.6–5.1)
Alkaline phosphatase (APISO): 94 U/L (ref 37–153)
BUN/Creatinine Ratio: 19 (calc) (ref 6–22)
BUN: 27 mg/dL — ABNORMAL HIGH (ref 7–25)
CO2: 27 mmol/L (ref 20–32)
Calcium: 9.2 mg/dL (ref 8.6–10.4)
Chloride: 97 mmol/L — ABNORMAL LOW (ref 98–110)
Creat: 1.39 mg/dL — ABNORMAL HIGH (ref 0.60–0.93)
GFR, Est African American: 42 mL/min/{1.73_m2} — ABNORMAL LOW (ref >=60)
GFR, Est Non African American: 36 mL/min/{1.73_m2} — ABNORMAL LOW (ref >=60)
Globulin: 2.4 g/dL (calc) (ref 1.9–3.7)
Glucose, Bld: 98 mg/dL (ref 65–139)
Potassium: 4.7 mmol/L (ref 3.5–5.3)
Sodium: 133 mmol/L — ABNORMAL LOW (ref 135–146)
Total Bilirubin: 0.6 mg/dL (ref 0.2–1.2)
Total Protein: 6.3 g/dL (ref 6.1–8.1)

## 2020-07-09 LAB — CBC WITH DIFFERENTIAL/PLATELET
Absolute Monocytes: 877 cells/uL (ref 200–950)
Basophils Absolute: 75 cells/uL (ref 0–200)
Basophils Relative: 0.7 %
Eosinophils Absolute: 139 cells/uL (ref 15–500)
Eosinophils Relative: 1.3 %
HCT: 38 % (ref 35.0–45.0)
Hemoglobin: 12.6 g/dL (ref 11.7–15.5)
Lymphs Abs: 728 cells/uL — ABNORMAL LOW (ref 850–3900)
MCH: 29.4 pg (ref 27.0–33.0)
MCHC: 33.2 g/dL (ref 32.0–36.0)
MCV: 88.8 fL (ref 80.0–100.0)
MPV: 9.3 fL (ref 7.5–12.5)
Monocytes Relative: 8.2 %
Neutro Abs: 8881 cells/uL — ABNORMAL HIGH (ref 1500–7800)
Neutrophils Relative %: 83 %
Platelets: 328 10*3/uL (ref 140–400)
RBC: 4.28 10*6/uL (ref 3.80–5.10)
RDW: 15.9 % — ABNORMAL HIGH (ref 11.0–15.0)
Total Lymphocyte: 6.8 %
WBC: 10.7 10*3/uL (ref 3.8–10.8)

## 2020-07-09 LAB — BRAIN NATRIURETIC PEPTIDE: Brain Natriuretic Peptide: 165 pg/mL — ABNORMAL HIGH (ref <100)

## 2020-07-09 LAB — TSH: TSH: 3.02 mIU/L (ref 0.40–4.50)

## 2020-07-10 ENCOUNTER — Encounter: Payer: Self-pay | Admitting: Medical-Surgical

## 2020-07-14 ENCOUNTER — Ambulatory Visit (INDEPENDENT_AMBULATORY_CARE_PROVIDER_SITE_OTHER): Payer: Medicare Other | Admitting: Medical-Surgical

## 2020-07-14 ENCOUNTER — Other Ambulatory Visit: Payer: Self-pay

## 2020-07-14 ENCOUNTER — Encounter: Payer: Self-pay | Admitting: Medical-Surgical

## 2020-07-14 VITALS — BP 147/73 | HR 78 | Temp 97.5°F | Ht 61.0 in | Wt 209.0 lb

## 2020-07-14 DIAGNOSIS — R600 Localized edema: Secondary | ICD-10-CM

## 2020-07-14 DIAGNOSIS — I10 Essential (primary) hypertension: Secondary | ICD-10-CM | POA: Diagnosis not present

## 2020-07-14 DIAGNOSIS — I4891 Unspecified atrial fibrillation: Secondary | ICD-10-CM | POA: Diagnosis not present

## 2020-07-14 MED ORDER — CARVEDILOL 6.25 MG PO TABS: 6.2500 mg | ORAL_TABLET | Freq: Two times a day (BID) | ORAL | 3 refills | Status: DC

## 2020-07-14 NOTE — Progress Notes (Signed)
Subjective:    CC: 1 week follow up  HPI: Pleasant 80 year old female accompanied by her daughter presenting for 1 week follow up after starting Lasix for volume overload. Notes that she has been doing well and has started to feel much better. Her breathing is much better and she doesn't feel as fatigued. Her lower extremities remain swollen but to a slightly lesser degree. She is still have some slight weeping from the lower legs but this has reduced in frequency and quantity. Has tolerated her BID Lasix course well without dizziness, hypotension, or other side effect. Was seen by cardiology who stopped her Amlodipine and Cardizem and started her on Toprol XL BID. She has started the Toprol but notes that she is now having diarrhea, one particularly profuse episode last night. She notes that she had diarrhea with that medicine or a similar one in the past and had to be taken off it. Is not happy that she is experiencing diarrhea again and is wondering what her options are to maintain control of her heart rate as well as her blood pressure. Has an echocardiogram scheduled next week. Per patient and her daughter, they would like our office to manage her blood pressure management. Denies palpitations, SOB, chest pain, dizziness, headaches, and vision changes.   I reviewed the past medical history, family history, social history, surgical history, and allergies today and no changes were needed.  Please see the problem list section below in epic for further details.  Past Medical History: Past Medical History:  Diagnosis Date  . A-fib (HCC)   . Hypertension    Past Surgical History: Past Surgical History:  Procedure Laterality Date  . ABDOMINAL HYSTERECTOMY    . JOINT REPLACEMENT    . TONSILLECTOMY     Social History: Social History   Socioeconomic History  . Marital status: Married    Spouse name: Not on file  . Number of children: Not on file  . Years of education: Not on file  .  Highest education level: Not on file  Occupational History  . Not on file  Tobacco Use  . Smoking status: Never Smoker  . Smokeless tobacco: Never Used  Vaping Use  . Vaping Use: Never used  Substance and Sexual Activity  . Alcohol use: Never  . Drug use: Never  . Sexual activity: Not Currently    Birth control/protection: Post-menopausal, Abstinence  Other Topics Concern  . Not on file  Social History Narrative  . Not on file   Social Determinants of Health   Financial Resource Strain: Not on file  Food Insecurity: Not on file  Transportation Needs: Not on file  Physical Activity: Not on file  Stress: Not on file  Social Connections: Not on file   Family History: Family History  Problem Relation Age of Onset  . Diabetes Mother   . Skin cancer Other    Allergies: Allergies  Allergen Reactions  . Hydrochlorothiazide Other (See Comments)    Foot pain/gout exacerbation  . Sulfanilamide Nausea Only   Medications: See med rec.  Review of Systems: See HPI for pertinent positives and negatives.   Objective:    General: Well Developed, well nourished, and in no acute distress.  Neuro: Alert and oriented x3.  HEENT: Normocephalic, atraumatic.  Skin: Warm and dry. Cardiac: Irregular rate and rhythm, no murmurs rubs or gallops, + lower extremity edema.  Respiratory: Clear to auscultation bilaterally. Not using accessory muscles, speaking in full sentences.  Impression and Recommendations:  1. Atrial fibrillation, unspecified type (HCC) Regular rate and rhythm from 1 week ago has reverted to irregular. No RVR. Discussed options to manage A fib. Patient not tolerating Metoprolol and diarrhea increases an already high fall risk. Switching from Metoprolol to Coreg BID since this is a bit more potent and has more effectiveness against HTN. Hopefully this will be better tolerated. Encouraged keeping echocardiogram appointment so we will have a good idea of her cardiac  function. Discussed starting Eliquis as directed by Cardiology. She was given a 30-day supply free card. Provided a $10 copay card to see if this will help with the costs. Discussed bleeding precautions while on Eliquis.   2. Bilateral lower extremity edema Rechecking electrolytes and renal function to evaluate effects of Lasix. Ultimately, she does still have quite a bit of dependent edema and could use an extended course of Lasix, albeit at a lower dose. Consider reducing dose to 20-40mg  daily but lab results will determine if we can continue this for now.  - BASIC METABOLIC PANEL WITH GFR  Return in about 2 weeks (around 07/28/2020) for HTN/Afib follow up. ___________________________________________ Thayer Ohm, DNP, APRN, FNP-BC Primary Care and Sports Medicine [facility] [place]

## 2020-07-15 LAB — BASIC METABOLIC PANEL WITH GFR
BUN/Creatinine Ratio: 23 (calc) — ABNORMAL HIGH (ref 6–22)
BUN: 38 mg/dL — ABNORMAL HIGH (ref 7–25)
CO2: 32 mmol/L (ref 20–32)
Calcium: 8.8 mg/dL (ref 8.6–10.4)
Chloride: 97 mmol/L — ABNORMAL LOW (ref 98–110)
Creat: 1.62 mg/dL — ABNORMAL HIGH (ref 0.60–0.93)
GFR, Est African American: 35 mL/min/{1.73_m2} — ABNORMAL LOW (ref >=60)
GFR, Est Non African American: 30 mL/min/{1.73_m2} — ABNORMAL LOW (ref >=60)
Glucose, Bld: 72 mg/dL (ref 65–99)
Potassium: 3.9 mmol/L (ref 3.5–5.3)
Sodium: 140 mmol/L (ref 135–146)

## 2020-07-15 MED ORDER — FUROSEMIDE 40 MG PO TABS: 40.0000 mg | ORAL_TABLET | Freq: Every day | ORAL | 0 refills | Status: DC

## 2020-07-15 NOTE — Addendum Note (Signed)
Addended byChristen Butter on: 07/15/2020 08:11 AM   Modules accepted: Orders

## 2020-07-28 ENCOUNTER — Other Ambulatory Visit: Payer: Self-pay

## 2020-07-28 ENCOUNTER — Ambulatory Visit (INDEPENDENT_AMBULATORY_CARE_PROVIDER_SITE_OTHER): Payer: Medicare Other | Admitting: Medical-Surgical

## 2020-07-28 ENCOUNTER — Encounter: Payer: Self-pay | Admitting: Medical-Surgical

## 2020-07-28 VITALS — BP 144/85 | HR 85 | Temp 97.6°F | Ht 61.0 in | Wt 211.0 lb

## 2020-07-28 DIAGNOSIS — R600 Localized edema: Secondary | ICD-10-CM

## 2020-07-28 DIAGNOSIS — I4891 Unspecified atrial fibrillation: Secondary | ICD-10-CM | POA: Diagnosis not present

## 2020-07-28 DIAGNOSIS — I10 Essential (primary) hypertension: Secondary | ICD-10-CM

## 2020-07-28 DIAGNOSIS — N1832 Chronic kidney disease, stage 3b: Secondary | ICD-10-CM | POA: Diagnosis not present

## 2020-07-28 MED ORDER — FUROSEMIDE 40 MG PO TABS: 40.0000 mg | ORAL_TABLET | Freq: Every day | ORAL | 0 refills | Status: DC

## 2020-07-28 NOTE — Progress Notes (Signed)
Subjective:    CC: HTN/Afib follow up  HPI: Pleasant 80 year old female accompanied by her daughter-in-law presenting for follow-up on hypertension and A. fib.  She has been taking Coreg 6.25 mg twice daily, tolerating well.  Notes that the diarrhea she experienced with metoprolol has improved although she still has a few episodes periodically.  She notes that her current level of diarrhea is very manageable.  She continues to take apixaban 5 mg twice daily as prescribed, tolerating well without side effects.  She is also taking Lasix 40 mg daily as prescribed.  Notes that she is not voiding quite as much as she did on the twice daily dosing which is expected.  She does have intermittent mild shortness of breath but this is much improved from before starting Lasix.  Continues to have bilateral lower extremity edema with weeping of serous fluid.  Was able to get compression stockings with 20-30 mmHg compression from [employer] and is wearing those today.  Denies fever, chills, worsening shortness of breath, chest pain, and palpitations.  She did have her echocardiogram done with [facility] cardiology but this report is not in the system and that she has not been notified of her results.  I reviewed the past medical history, family history, social history, surgical history, and allergies today and no changes were needed.  Please see the problem list section below in epic for further details.  Past Medical History: Past Medical History:  Diagnosis Date  . A-fib (HCC)   . Hypertension    Past Surgical History: Past Surgical History:  Procedure Laterality Date  . ABDOMINAL HYSTERECTOMY    . JOINT REPLACEMENT    . TONSILLECTOMY     Social History: Social History   Socioeconomic History  . Marital status: Married    Spouse name: Not on file  . Number of children: Not on file  . Years of education: Not on file  . Highest education level: Not on file  Occupational History  . Not on file  Tobacco  Use  . Smoking status: Never Smoker  . Smokeless tobacco: Never Used  Vaping Use  . Vaping Use: Never used  Substance and Sexual Activity  . Alcohol use: Never  . Drug use: Never  . Sexual activity: Not Currently    Birth control/protection: Post-menopausal, Abstinence  Other Topics Concern  . Not on file  Social History Narrative  . Not on file   Social Determinants of Health   Financial Resource Strain: Not on file  Food Insecurity: Not on file  Transportation Needs: Not on file  Physical Activity: Not on file  Stress: Not on file  Social Connections: Not on file   Family History: Family History  Problem Relation Age of Onset  . Diabetes Mother   . Skin cancer Other    Allergies: Allergies  Allergen Reactions  . Hydrochlorothiazide Other (See Comments)    Foot pain/gout exacerbation  . Sulfanilamide Nausea Only   Medications: See med rec.  Review of Systems: See HPI for pertinent positives and negatives.   Objective:    General: Well Developed, well nourished, and in no acute distress.  Neuro: Alert and oriented x3.  HEENT: Normocephalic, atraumatic.  Skin: Warm and dry. Cardiac: Regular rate and rhythm, no murmurs rubs or gallops, + bilateral lower extremity edema weeping serous fluid right greater than left.  Respiratory: Scattered fine bibasilar crackles posteriorly. Not using accessory muscles, speaking in full sentences.  Impression and Recommendations:    1. Essential hypertension Blood  pressure 144/85 today.  Continue Coreg 6.25 mg twice daily and Lasix as prescribed.  Since we are finding a balance between side effects versus blood pressure control, this is an acceptable reading at this point.  2. Atrial fibrillation, unspecified type (HCC) Continue Coreg and Eliquis as prescribed.  3. Bilateral lower extremity edema Continue Lasix 40 mg daily.  Rechecking BMP with GFR to evaluate kidney function.  If okay, will likely add an extra 20 mg dose 3  days/week as needed.  Referring to physical therapy for lymphedema clinic. - BASIC METABOLIC PANEL WITH GFR - Ambulatory referral to Physical Therapy  4. Stage 3b chronic kidney disease (HCC) Rechecking BMP. - BASIC METABOLIC PANEL WITH GFR  Return in about 4 weeks (around 08/25/2020) for Afib/HTN follow up. ___________________________________________ Thayer Ohm, DNP, APRN, FNP-BC Primary Care and Sports Medicine [facility] [place]

## 2020-07-29 LAB — BASIC METABOLIC PANEL WITH GFR
BUN/Creatinine Ratio: 21 (calc) (ref 6–22)
BUN: 29 mg/dL — ABNORMAL HIGH (ref 7–25)
CO2: 25 mmol/L (ref 20–32)
Calcium: 8.9 mg/dL (ref 8.6–10.4)
Chloride: 104 mmol/L (ref 98–110)
Creat: 1.41 mg/dL — ABNORMAL HIGH (ref 0.60–0.93)
GFR, Est African American: 41 mL/min/{1.73_m2} — ABNORMAL LOW (ref >=60)
GFR, Est Non African American: 35 mL/min/{1.73_m2} — ABNORMAL LOW (ref >=60)
Glucose, Bld: 98 mg/dL (ref 65–139)
Potassium: 4.6 mmol/L (ref 3.5–5.3)
Sodium: 140 mmol/L (ref 135–146)

## 2020-07-29 MED ORDER — FUROSEMIDE 20 MG PO TABS: ORAL_TABLET | ORAL | 3 refills | Status: DC

## 2020-07-29 NOTE — Addendum Note (Signed)
Addended byChristen Butter on: 07/29/2020 08:02 AM   Modules accepted: Orders

## 2020-08-07 ENCOUNTER — Other Ambulatory Visit: Payer: Self-pay | Admitting: Medical-Surgical

## 2020-08-18 ENCOUNTER — Other Ambulatory Visit: Payer: Self-pay

## 2020-08-18 DIAGNOSIS — I4891 Unspecified atrial fibrillation: Secondary | ICD-10-CM

## 2020-08-18 MED ORDER — APIXABAN 5 MG PO TABS: 5.0000 mg | ORAL_TABLET | Freq: Two times a day (BID) | ORAL | 0 refills | Status: DC

## 2020-08-21 ENCOUNTER — Telehealth: Payer: Self-pay

## 2020-08-21 NOTE — Telephone Encounter (Signed)
Pt called stating that she went to the pharmacy to pick up her Rx for Eliquis and for a #30 day supply it will cost her $142. Pt is wanting to know if there is a cheaper alternative. She is scheduled for an OV on 08/25/2020

## 2020-08-21 NOTE — Telephone Encounter (Signed)
Pt advised of recommendations. She doesn't want to start Coumadin. She has an appt on Tuesday and said that she will discuss this more with her at that time.

## 2020-08-21 NOTE — Telephone Encounter (Signed)
The cheapest alternative for her is going to be Coumadin which requires weekly visits to our office for INR checks.  Other options that do not require blood monitoring are just as expensive as Eliquis.  We can always try filling out patient assistance forms to see if she would qualify.  Will also forward to our clinical pharmacist to see if she has any other suggestions.

## 2020-08-24 NOTE — Telephone Encounter (Signed)
The most beneficial and evidence-based therapy would definitely be Eliquis, but cost barriers are particularly challenging with medicare as the insurer.   Financial assistance would be possible once the following is met: - Patient does not exceed annual income threshold of 300% federal poverty limits (see image below) - Must first spend 3% of household income in medication costs (spouse's costs can be combined/included), info obtained from their pharmacy - If above is met, primary care clinic may contact [phone] to initiate assistance & subsequent refill requests  Feel free to ask these questions at patient's visit to determine eligibility, or I can also drop by the room tomorrow for discussion with patient. I am happy to help!  Thanks, Lysle Morales

## 2020-08-25 ENCOUNTER — Other Ambulatory Visit: Payer: Self-pay

## 2020-08-25 ENCOUNTER — Ambulatory Visit (INDEPENDENT_AMBULATORY_CARE_PROVIDER_SITE_OTHER): Payer: Medicare Other | Admitting: Medical-Surgical

## 2020-08-25 ENCOUNTER — Encounter: Payer: Self-pay | Admitting: Medical-Surgical

## 2020-08-25 VITALS — BP 147/86 | HR 89 | Temp 97.6°F | Ht 61.0 in | Wt 202.4 lb

## 2020-08-25 DIAGNOSIS — R600 Localized edema: Secondary | ICD-10-CM | POA: Diagnosis not present

## 2020-08-25 DIAGNOSIS — I4891 Unspecified atrial fibrillation: Secondary | ICD-10-CM

## 2020-08-25 DIAGNOSIS — I10 Essential (primary) hypertension: Secondary | ICD-10-CM

## 2020-08-25 MED ORDER — CARVEDILOL 6.25 MG PO TABS: ORAL_TABLET | ORAL | 3 refills | Status: DC

## 2020-08-25 MED ORDER — ASPIRIN EC 81 MG PO TBEC: 81.0000 mg | DELAYED_RELEASE_TABLET | Freq: Every day | ORAL | 3 refills | Status: DC

## 2020-08-25 NOTE — Progress Notes (Signed)
Subjective:    CC: A fib follow up  HPI: Pleasant 80 year old female accompanied by her daughter presenting for follow-up on A. fib.  Has been taking carvedilol 6.25 mg twice daily, tolerating well without side effects.  Has recently been taking Eliquis as prescribed but unfortunately the cost of the medication is prohibitive.  Is interested in starting a conversation regarding alternatives today.  She is doing well on Lasix 40 mg daily with an extra 20 mg dose 3 times weekly.  Notes that her breathing is much improved as is the swelling in her legs.  She does continue to have some serous weeping of the right lower extremity but the left lower extremity has stopped.  She is wearing compression stockings when up out of bed and limiting her fluid intake.  Denies fever, chills, shortness of breath at rest, palpitations, chest pain, vision changes, and dizziness  I reviewed the past medical history, family history, social history, surgical history, and allergies today and no changes were needed.  Please see the problem list section below in epic for further details.  Past Medical History: Past Medical History:  Diagnosis Date  . A-fib (HCC)   . Hypertension    Past Surgical History: Past Surgical History:  Procedure Laterality Date  . ABDOMINAL HYSTERECTOMY    . JOINT REPLACEMENT    . TONSILLECTOMY     Social History: Social History   Socioeconomic History  . Marital status: Married    Spouse name: Not on file  . Number of children: Not on file  . Years of education: Not on file  . Highest education level: Not on file  Occupational History  . Not on file  Tobacco Use  . Smoking status: Never Smoker  . Smokeless tobacco: Never Used  Vaping Use  . Vaping Use: Never used  Substance and Sexual Activity  . Alcohol use: Never  . Drug use: Never  . Sexual activity: Not Currently    Birth control/protection: Post-menopausal, Abstinence  Other Topics Concern  . Not on file  Social  History Narrative  . Not on file   Social Determinants of Health   Financial Resource Strain: Not on file  Food Insecurity: Not on file  Transportation Needs: Not on file  Physical Activity: Not on file  Stress: Not on file  Social Connections: Not on file   Family History: Family History  Problem Relation Age of Onset  . Diabetes Mother   . Skin cancer Other    Allergies: Allergies  Allergen Reactions  . Hydrochlorothiazide Other (See Comments)    Foot pain/gout exacerbation  . Sulfanilamide Nausea Only   Medications: See med rec.  Review of Systems: See HPI for pertinent positives and negatives.   Objective:    General: Well Developed, well nourished, and in no acute distress.  Neuro: Alert and oriented x3.  HEENT: Normocephalic, atraumatic.  Skin: Warm and dry. Cardiac: Regular rate and irregular rhythm, no murmurs rubs or gallops, + lower extremity edema.  Respiratory: Clear to auscultation bilaterally. Not using accessory muscles, speaking in full sentences.  Impression and Recommendations:    1. Essential hypertension Blood pressure not quite at goal today.  Increasing Coreg to 12.5 mg every morning with 6.25 mg every evening with dinner.  Advised to monitor blood pressure at home.  I will reach out to her in a couple of weeks to see how her home pressures are running.  At that time, if still not at goal we may need to  increase her evening dose as well.  2. Atrial fibrillation, unspecified type (HCC) Rhythm still irregular but rate controlled.  Continue Coreg as described above.  Discussed anticoagulation.  As she cannot continue to afford Eliquis, our other options are Coumadin or monotherapy with aspirin 81 mg daily.  After discussion of monitoring requirements for Coumadin, she does not want to go that route.  Starting aspirin 81 mg daily.  Patient and daughter aware of risks of clotting versus bleeding in the setting of A. fib.  3. Bilateral lower extremity  edema Improvement in lower extremity edema noted with current 40 mg daily of Lasix with 20 mg extra dose on 3 days/week.  Checking BMP to evaluate renal response to current dosages. - BASIC METABOLIC PANEL WITH GFR  No follow-ups on file. ___________________________________________ Thayer Ohm, DNP, APRN, FNP-BC Primary Care and Sports Medicine [facility] [place]

## 2020-08-26 LAB — BASIC METABOLIC PANEL WITH GFR
BUN/Creatinine Ratio: 15 (calc) (ref 6–22)
BUN: 18 mg/dL (ref 7–25)
CO2: 27 mmol/L (ref 20–32)
Calcium: 9 mg/dL (ref 8.6–10.4)
Chloride: 101 mmol/L (ref 98–110)
Creat: 1.17 mg/dL — ABNORMAL HIGH (ref 0.60–0.93)
GFR, Est African American: 51 mL/min/{1.73_m2} — ABNORMAL LOW (ref >=60)
GFR, Est Non African American: 44 mL/min/{1.73_m2} — ABNORMAL LOW (ref >=60)
Glucose, Bld: 100 mg/dL — ABNORMAL HIGH (ref 65–99)
Potassium: 4.3 mmol/L (ref 3.5–5.3)
Sodium: 138 mmol/L (ref 135–146)

## 2020-09-04 ENCOUNTER — Other Ambulatory Visit: Payer: Self-pay | Admitting: Medical-Surgical

## 2020-09-08 ENCOUNTER — Encounter: Payer: Self-pay | Admitting: Medical-Surgical

## 2020-10-06 ENCOUNTER — Ambulatory Visit (INDEPENDENT_AMBULATORY_CARE_PROVIDER_SITE_OTHER): Payer: Medicare Other

## 2020-10-06 ENCOUNTER — Ambulatory Visit (INDEPENDENT_AMBULATORY_CARE_PROVIDER_SITE_OTHER): Payer: Medicare Other | Admitting: Medical-Surgical

## 2020-10-06 ENCOUNTER — Encounter: Payer: Self-pay | Admitting: Medical-Surgical

## 2020-10-06 ENCOUNTER — Other Ambulatory Visit: Payer: Self-pay

## 2020-10-06 VITALS — BP 135/81 | HR 89 | Temp 97.8°F | Ht 61.0 in | Wt 199.0 lb

## 2020-10-06 DIAGNOSIS — R062 Wheezing: Secondary | ICD-10-CM

## 2020-10-06 DIAGNOSIS — Z1159 Encounter for screening for other viral diseases: Secondary | ICD-10-CM

## 2020-10-06 DIAGNOSIS — R0600 Dyspnea, unspecified: Secondary | ICD-10-CM

## 2020-10-06 DIAGNOSIS — R0609 Other forms of dyspnea: Secondary | ICD-10-CM

## 2020-10-06 DIAGNOSIS — I4891 Unspecified atrial fibrillation: Secondary | ICD-10-CM

## 2020-10-06 DIAGNOSIS — I10 Essential (primary) hypertension: Secondary | ICD-10-CM | POA: Diagnosis not present

## 2020-10-06 MED ORDER — ALBUTEROL SULFATE HFA 108 (90 BASE) MCG/ACT IN AERS: 2.0000 | INHALATION_SPRAY | Freq: Four times a day (QID) | RESPIRATORY_TRACT | 11 refills | Status: DC | PRN

## 2020-10-06 NOTE — Progress Notes (Signed)
Subjective:    CC: HTN/A fib follow up  HPI: Pleasant 79 year old female accompanied by her daughter presenting today for follow-up on hypertension and A. fib.  Has been taking carvedilol 6.25 mg twice daily, tolerating well without side effects.  We did try to increase her morning dose to 12.5 mg and maintain the evening dose of 6.25 mg but she developed diarrhea with the higher morning dose.  Notes that she does still have some dyspnea on exertion but this has somewhat improved.  Her lower extremity edema has also improved with the use of furosemide to 40 mg daily with an extra 20 mg dose 3 times weekly.  Notes that her right lower extremity does still have 3 very small spots with small amounts of weeping serous fluid but the left lower extremity has stopped weeping altogether.  She is wearing compression stockings as instructed and doing very well with this.  Limiting her p.o. fluid intake as well as her sodium intake.  Has had some off-and-on wheezing over the past few weeks.  Endorses using an albuterol inhaler after being diagnosed with pneumonia.  Also notes that she may have had something similar to asthma in her childhood but was never formally diagnosed.  Denies fever, chills, chest pain, palpitations, dyspnea at rest, and GI symptoms.  I reviewed the past medical history, family history, social history, surgical history, and allergies today and no changes were needed.  Please see the problem list section below in epic for further details.  Past Medical History: Past Medical History:  Diagnosis Date  . A-fib (HCC)   . Hypertension    Past Surgical History: Past Surgical History:  Procedure Laterality Date  . ABDOMINAL HYSTERECTOMY    . JOINT REPLACEMENT    . TONSILLECTOMY     Social History: Social History   Socioeconomic History  . Marital status: Married    Spouse name: Not on file  . Number of children: Not on file  . Years of education: Not on file  . Highest education  level: Not on file  Occupational History  . Not on file  Tobacco Use  . Smoking status: Never Smoker  . Smokeless tobacco: Never Used  Vaping Use  . Vaping Use: Never used  Substance and Sexual Activity  . Alcohol use: Never  . Drug use: Never  . Sexual activity: Not Currently    Birth control/protection: Post-menopausal, Abstinence  Other Topics Concern  . Not on file  Social History Narrative  . Not on file   Social Determinants of Health   Financial Resource Strain: Not on file  Food Insecurity: Not on file  Transportation Needs: Not on file  Physical Activity: Not on file  Stress: Not on file  Social Connections: Not on file   Family History: Family History  Problem Relation Age of Onset  . Diabetes Mother   . Skin cancer Other    Allergies: Allergies  Allergen Reactions  . Hydrochlorothiazide Other (See Comments)    Foot pain/gout exacerbation  . Sulfanilamide Nausea Only   Medications: See med rec.  Review of Systems: See HPI for pertinent positives and negatives.   Objective:    General: Well Developed, well nourished, and in no acute distress.  Neuro: Alert and oriented x3.  HEENT: Normocephalic, atraumatic.  Skin: Warm and dry. Cardiac: Regular rate and rhythm, no murmurs rubs or gallops, no lower extremity edema.  Respiratory: Clear to auscultation bilaterally. Not using accessory muscles, speaking in full sentences.  Impression and  Recommendations:    1. Essential hypertension Home blood pressure list shows systolic blood pressure between 114 and 150s with diastolic pressures mostly in the 70s.  Recommend trialing 1.5 tablets of the Coreg twice daily to see if this produces side effects or if it is better tolerated than 2 tablets.  Continue sodium restriction and regular activity.  2. Atrial fibrillation, unspecified type (HCC) Home heart rates in the 80s and 90s with the lowest recorded at 70.  She is not symptomatic and seems to be doing well on  rate control.  Continue Coreg as described above.  3. Wheezing 4. Dyspnea on exertion We will get an updated chest x-ray today.  Sending out an albuterol inhaler in for as needed use.  If using this several times daily, we may need to look at adding a long-acting beta agonist or an inhaled corticosteroid. - DG Chest 2 View; Future  5. Need for hepatitis C screening test Discussed many recommendations.  She is agreeable so we will add this to her next blood draw. - Hepatitis C antibody  Return in about 3 months (around 01/06/2021) for HTN/A fib follow up. ___________________________________________ Thayer Ohm, DNP, APRN, FNP-BC Primary Care and Sports Medicine [facility] [place]

## 2020-10-10 ENCOUNTER — Other Ambulatory Visit: Payer: Self-pay | Admitting: Medical-Surgical

## 2020-10-14 ENCOUNTER — Ambulatory Visit (INDEPENDENT_AMBULATORY_CARE_PROVIDER_SITE_OTHER): Payer: Medicare Other

## 2020-10-14 ENCOUNTER — Other Ambulatory Visit: Payer: Self-pay

## 2020-10-14 DIAGNOSIS — Z13820 Encounter for screening for osteoporosis: Secondary | ICD-10-CM | POA: Diagnosis not present

## 2020-10-15 ENCOUNTER — Encounter: Payer: Self-pay | Admitting: Medical-Surgical

## 2020-10-24 ENCOUNTER — Other Ambulatory Visit: Payer: Self-pay | Admitting: Medical-Surgical

## 2020-10-30 ENCOUNTER — Encounter: Payer: Self-pay | Admitting: Medical-Surgical

## 2020-10-30 ENCOUNTER — Other Ambulatory Visit: Payer: Self-pay | Admitting: Medical-Surgical

## 2020-10-30 DIAGNOSIS — Z6837 Body mass index (BMI) 37.0-37.9, adult: Secondary | ICD-10-CM

## 2020-11-05 ENCOUNTER — Encounter: Payer: Self-pay | Admitting: Medical-Surgical

## 2020-11-09 ENCOUNTER — Other Ambulatory Visit: Payer: Self-pay

## 2020-11-09 DIAGNOSIS — N1832 Chronic kidney disease, stage 3b: Secondary | ICD-10-CM

## 2020-11-09 NOTE — Progress Notes (Unsigned)
Please see patient portal message sent to pt regarding referral being reordered.

## 2020-12-15 ENCOUNTER — Ambulatory Visit (INDEPENDENT_AMBULATORY_CARE_PROVIDER_SITE_OTHER): Payer: Medicare Other | Admitting: Medical-Surgical

## 2020-12-15 DIAGNOSIS — Z0000 Encounter for general adult medical examination without abnormal findings: Secondary | ICD-10-CM | POA: Diagnosis not present

## 2020-12-15 NOTE — Patient Instructions (Addendum)
MEDICARE ANNUAL WELLNESS VISIT Health Maintenance Summary and Written Plan of Care  Ms. Maple Hudson ,  Thank you for allowing me to perform your Medicare Annual Wellness Visit and for your ongoing commitment to your health.   Health Maintenance & Immunization History Health Maintenance  Topic Date Due  . COVID-19 Vaccine (1) 12/31/2020 (Originally 03/18/1946)  . Zoster Vaccines- Shingrix (1 of 2) 03/17/2021 (Originally 03/19/1991)  . TETANUS/TDAP  07/08/2021 (Originally 02/28/2007)  . PNA vac Low Risk Adult (1 of 2 - PCV13) 07/08/2021 (Originally 03/18/2006)  . Hepatitis C Screening  12/15/2021 (Originally 03/19/1959)  . INFLUENZA VACCINE  12/21/2020  . DEXA SCAN  10/15/2022  . HPV VACCINES  Aged Out   Immunization History  Administered Date(s) Administered  . Td 02/27/1997    These are the patient goals that we discussed:  Goals Addressed               This Visit's Progress   .  Patient Stated (pt-stated)        12/15/2020 AWV Goal: Exercise for General Health  Patient will verbalize understanding of the benefits of increased physical activity: Exercising regularly is important. It will improve your overall fitness, flexibility, and endurance. Regular exercise also will improve your overall health. It can help you control your weight, reduce stress, and improve your bone density. Over the next year, patient will increase physical activity as tolerated with a goal of at least 150 minutes of moderate physical activity per week.  You can tell that you are exercising at a moderate intensity if your heart starts beating faster and you start breathing faster but can still hold a conversation. Moderate-intensity exercise ideas include: Walking 1 mile (1.6 km) in about 15 minutes Biking Hiking Golfing Dancing Water aerobics Patient will verbalize understanding of everyday activities that increase physical activity by providing examples like the following: Yard work, such  as: Insurance underwriter Gardening Washing windows or floors Patient will be able to explain general safety guidelines for exercising:  Before you start a new exercise program, talk with your health care provider. Do not exercise so much that you hurt yourself, feel dizzy, or get very short of breath. Wear comfortable clothes and wear shoes with good support. Drink plenty of water while you exercise to prevent dehydration or heat stroke. Work out until your breathing and your heartbeat get faster.          This is a list of Health Maintenance Items that are overdue or due now: Pneumococcal vaccine  Influenza vaccine Td vaccine Shingles vaccine Covid vaccine  Patient declined all of the vaccines at this time.  Orders/Referrals Placed Today: No orders of the defined types were placed in this encounter.  (Contact our referral department at [phone] if you have not spoken with someone about your referral appointment within the next 5 days)    Follow-up Plan Follow-up with Christen Butter, NP as planned Medicare wellness visit in one year. AVS printed and mailed to the patient.      Health Maintenance, Female Adopting a healthy lifestyle and getting preventive care are important in promoting health and wellness. Ask your health care provider about: The right schedule for you to have regular tests and exams. Things you can do on your own to prevent diseases and keep yourself healthy. What should I know about diet, weight, and exercise? Eat a healthy diet  Eat a diet that includes  plenty of vegetables, fruits, low-fat dairy products, and lean protein. Do not eat a lot of foods that are high in solid fats, added sugars, or sodium.  Maintain a healthy weight Body mass index (BMI) is used to identify weight problems. It estimates body fat based on height and weight. Your health care provider can  help determineyour BMI and help you achieve or maintain a healthy weight. Get regular exercise Get regular exercise. This is one of the most important things you can do for your health. Most adults should: Exercise for at least 150 minutes each week. The exercise should increase your heart rate and make you sweat (moderate-intensity exercise). Do strengthening exercises at least twice a week. This is in addition to the moderate-intensity exercise. Spend less time sitting. Even light physical activity can be beneficial. Watch cholesterol and blood lipids Have your blood tested for lipids and cholesterol at 80 years of age, then havethis test every 5 years. Have your cholesterol levels checked more often if: Your lipid or cholesterol levels are high. You are older than 80 years of age. You are at high risk for heart disease. What should I know about cancer screening? Depending on your health history and family history, you may need to have cancer screening at various ages. This may include screening for: Breast cancer. Cervical cancer. Colorectal cancer. Skin cancer. Lung cancer. What should I know about heart disease, diabetes, and high blood pressure? Blood pressure and heart disease High blood pressure causes heart disease and increases the risk of stroke. This is more likely to develop in people who have high blood pressure readings, are of African descent, or are overweight. Have your blood pressure checked: Every 3-5 years if you are 14-14 years of age. Every year if you are 43 years old or older. Diabetes Have regular diabetes screenings. This checks your fasting blood sugar level. Have the screening done: Once every three years after age 42 if you are at a normal weight and have a low risk for diabetes. More often and at a younger age if you are overweight or have a high risk for diabetes. What should I know about preventing infection? Hepatitis B If you have a higher risk for  hepatitis B, you should be screened for this virus. Talk with your health care provider to find out if you are at risk forhepatitis B infection. Hepatitis C Testing is recommended for: Everyone born from [birth year] through 1965. Anyone with known risk factors for hepatitis C. Sexually transmitted infections (STIs) Get screened for STIs, including gonorrhea and chlamydia, if: You are sexually active and are younger than 80 years of age. You are older than 79 years of age and your health care provider tells you that you are at risk for this type of infection. Your sexual activity has changed since you were last screened, and you are at increased risk for chlamydia or gonorrhea. Ask your health care provider if you are at risk. Ask your health care provider about whether you are at high risk for HIV. Your health care provider may recommend a prescription medicine to help prevent HIV infection. If you choose to take medicine to prevent HIV, you should first get tested for HIV. You should then be tested every 3 months for as long as you are taking the medicine. Pregnancy If you are about to stop having your period (premenopausal) and you may become pregnant, seek counseling before you get pregnant. Take 400 to 800 micrograms (mcg) of folic acid  every day if you become pregnant. Ask for birth control (contraception) if you want to prevent pregnancy. Osteoporosis and menopause Osteoporosis is a disease in which the bones lose minerals and strength with aging. This can result in bone fractures. If you are 74 years old or older, or if you are at risk for osteoporosis and fractures, ask your health care provider if you should: Be screened for bone loss. Take a calcium or vitamin D supplement to lower your risk of fractures. Be given hormone replacement therapy (HRT) to treat symptoms of menopause. Follow these instructions at home: Lifestyle Do not use any products that contain nicotine or tobacco, such as  cigarettes, e-cigarettes, and chewing tobacco. If you need help quitting, ask your health care provider. Do not use street drugs. Do not share needles. Ask your health care provider for help if you need support or information about quitting drugs. Alcohol use Do not drink alcohol if: Your health care provider tells you not to drink. You are pregnant, may be pregnant, or are planning to become pregnant. If you drink alcohol: Limit how much you use to 0-1 drink a day. Limit intake if you are breastfeeding. Be aware of how much alcohol is in your drink. In the [place] one drink equals one 12 oz bottle of beer (355 mL), one 5 oz glass of wine (148 mL), or one 1 oz glass of hard liquor (44 mL). General instructions Schedule regular health, dental, and eye exams. Stay current with your vaccines. Tell your health care provider if: You often feel depressed. You have ever been abused or do not feel safe at home. Summary Adopting a healthy lifestyle and getting preventive care are important in promoting health and wellness. Follow your health care provider's instructions about healthy diet, exercising, and getting tested or screened for diseases. Follow your health care provider's instructions on monitoring your cholesterol and blood pressure. This information is not intended to replace advice given to you by your health care provider. Make sure you discuss any questions you have with your healthcare provider. Document Revised: 05/02/2018 Document Reviewed: 05/02/2018 Elsevier Patient Education  2022 [employer].

## 2020-12-15 NOTE — Progress Notes (Signed)
MEDICARE ANNUAL WELLNESS VISIT  12/15/2020  Telephone Visit Disclaimer This Medicare AWV was conducted by telephone due to national recommendations for restrictions regarding the COVID-19 Pandemic (e.g. social distancing).  I verified, using two identifiers, that I am speaking with Courtney Stevenson or their authorized healthcare agent. I discussed the limitations, risks, security, and privacy concerns of performing an evaluation and management service by telephone and the potential availability of an in-person appointment in the future. The patient expressed understanding and agreed to proceed.  Location of Patient: Home Location of Provider (nurse):  In the office.  Subjective:    Courtney Stevenson is a 80 y.o. female patient of Christen Butter, NP who had a Medicare Annual Wellness Visit today via telephone. Courtney Stevenson is Retired and lives with their spouse. she has 3 children. she reports that she is socially active and does interact with friends/family regularly. she is minimally physically active and enjoys reading.  Patient Care Team: Christen Butter, NP as PCP - General (Nurse Practitioner)  Advanced Directives 12/15/2020 07/10/2020  Does Patient Have a Medical Advance Directive? Yes Yes  Type of Advance Directive Living will;Healthcare Power of [employer] Power of [place];Living will  Does patient want to make changes to medical advance directive? No - Patient declined No - Patient declined  Copy of Healthcare Power of Attorney in Chart? No - copy requested No - copy requested    Hospital Utilization Over the Past 12 Months: # of hospitalizations or ER visits: 1 # of surgeries: 0  Review of Systems    Patient reports that her overall health is worse compared to last year.  History obtained from chart review and the patient  Patient Reported Readings (BP, Pulse, CBG, Weight, etc) none  Pain Assessment Pain : No/denies pain     Current Medications & Allergies  (verified) Allergies as of 12/15/2020       Reactions   Hydrochlorothiazide Other (See Comments)   Foot pain/gout exacerbation   Sulfanilamide Nausea Only        Medication List        Accurate as of December 15, 2020 11:21 AM. If you have any questions, ask your nurse or doctor.          albuterol 108 (90 Base) MCG/ACT inhaler Commonly known as: VENTOLIN HFA Inhale 2 puffs into the lungs every 6 (six) hours as needed for wheezing.   aspirin EC 81 MG tablet Take 1 tablet (81 mg total) by mouth daily.   carvedilol 6.25 MG tablet Commonly known as: COREG Take 12.5 mg (2 tablets) every morning and then 6.25 mg (1 tablet) every evening with dinner.   furosemide 40 MG tablet Commonly known as: LASIX TAKE 1 TABLET BY MOUTH EVERY DAY   furosemide 20 MG tablet Commonly known as: LASIX TAKE 20 MG ON MON, WED, AND FRI AFTERNOONS AS NEEDED. TAKE IN ADDITION TO THE DAILY 40MG  DOSE.        History (reviewed): Past Medical History:  Diagnosis Date   A-fib (HCC)    Hypertension    Past Surgical History:  Procedure Laterality Date   ABDOMINAL HYSTERECTOMY     JOINT REPLACEMENT     TONSILLECTOMY     Family History  Problem Relation Age of Onset   Diabetes Mother    Skin cancer Other    Social History   Socioeconomic History   Marital status: Married    Spouse name: Charmayne Odell   Number of children: 3   Years of  education: 14   Highest education level: Some college, no degree  Occupational History   Occupation: Retired  Tobacco Use   Smoking status: Never   Smokeless tobacco: Never  Vaping Use   Vaping Use: Never used  Substance and Sexual Activity   Alcohol use: Never   Drug use: Never   Sexual activity: Not Currently    Birth control/protection: Post-menopausal, Abstinence  Other Topics Concern   Not on file  Social History Narrative   Lives with her husband. She enjoys reading. She recently moved and is enjoying spending time with the new neighbors.    Social Determinants of Health   Financial Resource Strain: Low Risk    Difficulty of Paying Living Expenses: Not hard at all  Food Insecurity: No Food Insecurity   Worried About Programme researcher, broadcasting/film/video in the Last Year: Never true   Ran Out of Food in the Last Year: Never true  Transportation Needs: No Transportation Needs   Lack of Transportation (Medical): No   Lack of Transportation (Non-Medical): No  Physical Activity: Inactive   Days of Exercise per Week: 0 days   Minutes of Exercise per Session: 0 min  Stress: No Stress Concern Present   Feeling of Stress : Not at all  Social Connections: Moderately Integrated   Frequency of Communication with Friends and Family: More than three times a week   Frequency of Social Gatherings with Friends and Family: Twice a week   Attends Religious Services: 1 to 4 times per year   Active Member of [employer] or Organizations: No   Attends Banker Meetings: Never   Marital Status: Married    Activities of Daily Living In your present state of health, do you have any difficulty performing the following activities: 12/15/2020  Hearing? N  Vision? N  Difficulty concentrating or making decisions? N  Walking or climbing stairs? Y  Comment difficulty with climbing stairs; she is currently in physical therapy.  Dressing or bathing? N  Doing errands, shopping? N  Preparing Food and eating ? N  Using the Toilet? N  In the past six months, have you accidently leaked urine? N  Do you have problems with loss of bowel control? N  Managing your Medications? N  Managing your Finances? N  Housekeeping or managing your Housekeeping? N  Some recent data might be hidden    Patient Education/ Literacy How often do you need to have someone help you when you read instructions, pamphlets, or other written materials from your doctor or pharmacy?: 1 - Never What is the last grade level you completed in school?: 2 years of college  Exercise Current  Exercise Habits: The patient does not participate in regular exercise at present, Exercise limited by: None identified  Diet Patient reports consuming 3 meals a day and 0-1 snack(s) a day Patient reports that her primary diet is: Regular Patient reports that she does have regular access to food.   Depression Screen PHQ 2/9 Scores 12/15/2020 07/10/2020  PHQ - 2 Score 0 1  PHQ- 9 Score - 3     Fall Risk Fall Risk  12/15/2020  Falls in the past year? 0  Number falls in past yr: 0  Injury with Fall? 0  Risk for fall due to : No Fall Risks  Follow up Falls evaluation completed     Objective:  Courtney Stevenson seemed alert and oriented and she participated appropriately during our telephone visit.  Blood Pressure Weight BMI  BP  Readings from Last 3 Encounters:  10/06/20 135/81  08/25/20 (!) 147/86  07/28/20 (!) 144/85   Wt Readings from Last 3 Encounters:  10/06/20 199 lb (90.3 kg)  08/25/20 202 lb 6.4 oz (91.8 kg)  07/28/20 211 lb (95.7 kg)   BMI Readings from Last 1 Encounters:  10/06/20 37.60 kg/m    *Unable to obtain current vital signs, weight, and BMI due to telephone visit type  Hearing/Vision  Courtney Stevenson did not seem to have difficulty with hearing/understanding during the telephone conversation Reports that she has had a formal eye exam by an eye care professional within the past year Reports that she has not had a formal hearing evaluation within the past year *Unable to fully assess hearing and vision during telephone visit type  Cognitive Function: 6CIT Screen 12/15/2020  What Year? 0 points  What month? 0 points  What time? 0 points  Count back from 20 0 points  Months in reverse 0 points  Repeat phrase 0 points  Total Score 0   (Normal:0-7, Significant for Dysfunction: >8)  Normal Cognitive Function Screening: Yes   Immunization & Health Maintenance Record Immunization History  Administered Date(s) Administered   Td 02/27/1997    Health Maintenance   Topic Date Due   COVID-19 Vaccine (1) 12/31/2020 (Originally 03/18/1946)   Zoster Vaccines- Shingrix (1 of 2) 03/17/2021 (Originally 03/19/1991)   TETANUS/TDAP  07/08/2021 (Originally 02/28/2007)   PNA vac Low Risk Adult (1 of 2 - PCV13) 07/08/2021 (Originally 03/18/2006)   Hepatitis C Screening  12/15/2021 (Originally 03/19/1959)   INFLUENZA VACCINE  12/21/2020   DEXA SCAN  10/15/2022   HPV VACCINES  Aged Out       Assessment  This is a routine wellness examination for Courtney Stevenson.  Health Maintenance: Due or Overdue There are no preventive care reminders to display for this patient.   Courtney Stevenson does not need a referral for Community Assistance: Care Management:   no Social Work:    no Prescription Assistance:  no Nutrition/Diabetes Education:  no   Plan:  Personalized Goals  Goals Addressed               This Visit's Progress     Patient Stated (pt-stated)        12/15/2020 AWV Goal: Exercise for General Health  Patient will verbalize understanding of the benefits of increased physical activity: Exercising regularly is important. It will improve your overall fitness, flexibility, and endurance. Regular exercise also will improve your overall health. It can help you control your weight, reduce stress, and improve your bone density. Over the next year, patient will increase physical activity as tolerated with a goal of at least 150 minutes of moderate physical activity per week.  You can tell that you are exercising at a moderate intensity if your heart starts beating faster and you start breathing faster but can still hold a conversation. Moderate-intensity exercise ideas include: Walking 1 mile (1.6 km) in about 15 minutes Biking Hiking Golfing Dancing Water aerobics Patient will verbalize understanding of everyday activities that increase physical activity by providing examples like the following: Yard work, such as: Therapist, occupationalushing a lawn mower Raking and bagging  leaves Washing your car Pushing a stroller Shoveling snow Gardening Washing windows or floors Patient will be able to explain general safety guidelines for exercising:  Before you start a new exercise program, talk with your health care provider. Do not exercise so much that you hurt yourself, feel dizzy, or get very short of  breath. Wear comfortable clothes and wear shoes with good support. Drink plenty of water while you exercise to prevent dehydration or heat stroke. Work out until your breathing and your heartbeat get faster.        Personalized Health Maintenance & Screening Recommendations  Pneumococcal vaccine  Influenza vaccine Td vaccine Shingles vaccine Covid vaccine  Patient declined all of the vaccines at this time.  Lung Cancer Screening Recommended: no (Low Dose CT Chest recommended if Age 71-80 years, 30 pack-year currently smoking OR have quit w/in past 15 years) Hepatitis C Screening recommended: yes HIV Screening recommended: yes  Advanced Directives: Written information was not prepared per patient's request.  Referrals & Orders No orders of the defined types were placed in this encounter.   Follow-up Plan Follow-up with Christen Butter, NP as planned Medicare wellness visit in one year. AVS printed and mailed to the patient.   I have personally reviewed and noted the following in the patient's chart:   Medical and social history Use of alcohol, tobacco or illicit drugs  Current medications and supplements Functional ability and status Nutritional status Physical activity Advanced directives List of other physicians Hospitalizations, surgeries, and ER visits in previous 12 months Vitals Screenings to include cognitive, depression, and falls Referrals and appointments  In addition, I have reviewed and discussed with Courtney Stevenson certain preventive protocols, quality metrics, and best practice recommendations. A written personalized care plan for  preventive services as well as general preventive health recommendations is available and can be mailed to the patient at her request.      Modesto Charon, RN  12/15/2020

## 2021-01-13 ENCOUNTER — Ambulatory Visit: Payer: Medicare Other | Admitting: Medical-Surgical

## 2021-01-18 ENCOUNTER — Ambulatory Visit: Payer: Medicare Other | Admitting: Medical-Surgical

## 2021-01-19 ENCOUNTER — Ambulatory Visit: Payer: Medicare Other | Admitting: Medical-Surgical

## 2021-01-27 ENCOUNTER — Ambulatory Visit (INDEPENDENT_AMBULATORY_CARE_PROVIDER_SITE_OTHER): Payer: Medicare Other | Admitting: Medical-Surgical

## 2021-01-27 ENCOUNTER — Encounter: Payer: Self-pay | Admitting: Medical-Surgical

## 2021-01-27 ENCOUNTER — Other Ambulatory Visit: Payer: Self-pay

## 2021-01-27 VITALS — BP 125/75 | HR 79 | Resp 20 | Ht 61.0 in | Wt 198.4 lb

## 2021-01-27 DIAGNOSIS — I4891 Unspecified atrial fibrillation: Secondary | ICD-10-CM

## 2021-01-27 DIAGNOSIS — I10 Essential (primary) hypertension: Secondary | ICD-10-CM

## 2021-01-27 DIAGNOSIS — R600 Localized edema: Secondary | ICD-10-CM

## 2021-01-27 DIAGNOSIS — E039 Hypothyroidism, unspecified: Secondary | ICD-10-CM

## 2021-01-27 MED ORDER — FUROSEMIDE 20 MG PO TABS: ORAL_TABLET | ORAL | 1 refills | Status: DC

## 2021-01-27 MED ORDER — FUROSEMIDE 40 MG PO TABS: 40.0000 mg | ORAL_TABLET | Freq: Every day | ORAL | 1 refills | Status: DC

## 2021-01-27 MED ORDER — CARVEDILOL 6.25 MG PO TABS: 9.3750 mg | ORAL_TABLET | Freq: Two times a day (BID) | ORAL | 1 refills | Status: DC

## 2021-01-27 NOTE — Progress Notes (Signed)
  HPI with pertinent ROS:   CC: A. fib, hypertension, and hypothyroidism follow-up  HPI: Very pleasant 80 year old female presenting for chronic disease follow-up accompanied by her daughter.  She has been doing well since our last meeting.  She does still complain of bilateral lower extremity edema, right worse than left.  Her right lower extremity had improved greatly and was no longer weeping but this has gotten worse over the last few weeks.  She has had a little bit of serous weeping but nothing compared to what it was previously.  She is wearing compression stockings/socks most days but not every day.  She has been eating more deli meats and [place] bacon lately.  Had a gout flare about a week ago that affected the left leg and wonders if her dietary changes have contributed to that.  She drinks approximately 2 to 3 glasses of water per day.  Taking furosemide as prescribed.  Has not had any difficulties with palpitations or chest pain.  Taking carvedilol 6.25 mg, one half tab every morning and one half tab every evening as prescribed.  Tolerating this medication well and feels this is a good regimen for her.  Checks her blood pressure at home intermittently with readings at or below goal.  I reviewed the past medical history, family history, social history, surgical history, and allergies today and no changes were needed.  Please see the problem list section below in epic for further details.   Physical exam:   General: Well Developed, well nourished, and in no acute distress.  Neuro: Alert and oriented x3.  HEENT: Normocephalic, atraumatic.  Skin: Warm and dry. Cardiac: Irregular, regular rate and rhythm, no murmurs rubs or gallops, bilateral lower extremity edema right greater than left.  Respiratory: Clear to auscultation bilaterally. Not using accessory muscles, speaking in full sentences.  Impression and Recommendations:    1. Atrial fibrillation, unspecified type (HCC) Checking labs  today.  Continue carvedilol 6.25 mg tablets, take 1-1/2 tablets in the morning and in the afternoon.  Heart rate slightly irregular but very well controlled. - CBC with Differential/Platelet - Lipid panel - COMPLETE METABOLIC PANEL WITH GFR  2. Essential hypertension Managed with carvedilol as above.  Checking labs today. - CBC with Differential/Platelet - Lipid panel - COMPLETE METABOLIC PANEL WITH GFR  3. Bilateral lower extremity edema We will recheck her labs and make sure her kidney function looks good.  Recommend using compression stockings every single day while out of bed.  Also recommend reducing dietary sodium intake.  May benefit from custom fitted compression socks.  We will send a printed prescription to [facility] medical supply to see if they are able to get her fitted. - CBC with Differential/Platelet - Lipid panel - COMPLETE METABOLIC PANEL WITH GFR  4. Acquired hypothyroidism Checking TSH today. - TSH  Return in about 4 months (around 05/29/2021) for chronic disease follow up. ___________________________________________ Thayer Ohm, DNP, APRN, FNP-BC Primary Care and Sports Medicine [facility] [place]

## 2021-02-03 ENCOUNTER — Encounter: Payer: Self-pay | Admitting: Medical-Surgical

## 2021-02-08 LAB — COMPLETE METABOLIC PANEL WITH GFR
AG Ratio: 1.1 (calc) (ref 1.0–2.5)
ALT: 8 U/L (ref 6–29)
AST: 11 U/L (ref 10–35)
Albumin: 3.6 g/dL (ref 3.6–5.1)
Alkaline phosphatase (APISO): 84 U/L (ref 37–153)
BUN/Creatinine Ratio: 16 (calc) (ref 6–22)
BUN: 19 mg/dL (ref 7–25)
CO2: 27 mmol/L (ref 20–32)
Calcium: 8.6 mg/dL (ref 8.6–10.4)
Chloride: 104 mmol/L (ref 98–110)
Creat: 1.16 mg/dL — ABNORMAL HIGH (ref 0.60–1.00)
Globulin: 3.2 g/dL (calc) (ref 1.9–3.7)
Glucose, Bld: 84 mg/dL (ref 65–99)
Potassium: 4.3 mmol/L (ref 3.5–5.3)
Sodium: 140 mmol/L (ref 135–146)
Total Bilirubin: 0.6 mg/dL (ref 0.2–1.2)
Total Protein: 6.8 g/dL (ref 6.1–8.1)
eGFR: 48 mL/min/{1.73_m2} — ABNORMAL LOW (ref >=60)

## 2021-02-08 LAB — T4,TOTAL(THYROXINE)IMMUNOASSAY: T4,Total(Thyroxine): 7 ug/dL (ref 4.8–10.4)

## 2021-02-08 LAB — CBC WITH DIFFERENTIAL/PLATELET
Absolute Monocytes: 541 cells/uL (ref 200–950)
Basophils Absolute: 61 cells/uL (ref 0–200)
Basophils Relative: 1.2 %
Eosinophils Absolute: 143 cells/uL (ref 15–500)
Eosinophils Relative: 2.8 %
HCT: 43.6 % (ref 35.0–45.0)
Hemoglobin: 13.8 g/dL (ref 11.7–15.5)
Lymphs Abs: 995 cells/uL (ref 850–3900)
MCH: 27.8 pg (ref 27.0–33.0)
MCHC: 31.7 g/dL — ABNORMAL LOW (ref 32.0–36.0)
MCV: 87.7 fL (ref 80.0–100.0)
MPV: 9.9 fL (ref 7.5–12.5)
Monocytes Relative: 10.6 %
Neutro Abs: 3361 cells/uL (ref 1500–7800)
Neutrophils Relative %: 65.9 %
Platelets: 284 10*3/uL (ref 140–400)
RBC: 4.97 10*6/uL (ref 3.80–5.10)
RDW: 15.6 % — ABNORMAL HIGH (ref 11.0–15.0)
Total Lymphocyte: 19.5 %
WBC: 5.1 10*3/uL (ref 3.8–10.8)

## 2021-02-08 LAB — LIPID PANEL
Cholesterol: 97 mg/dL (ref <200)
HDL: 41 mg/dL — ABNORMAL LOW (ref >=50)
LDL Cholesterol (Calc): 42 mg/dL (calc)
Non-HDL Cholesterol (Calc): 56 mg/dL (calc) (ref <130)
Total CHOL/HDL Ratio: 2.4 (calc) (ref <5.0)
Triglycerides: 68 mg/dL (ref <150)

## 2021-02-08 LAB — T4, FREE: Free T4: 1.1 ng/dL (ref 0.8–1.8)

## 2021-02-08 LAB — T3, FREE: T3, Free: 3.1 pg/mL (ref 2.3–4.2)

## 2021-02-08 LAB — T3: T3, Total: 125 ng/dL (ref 76–181)

## 2021-02-08 LAB — TEST AUTHORIZATION

## 2021-02-08 LAB — TSH: TSH: 6.37 mIU/L — ABNORMAL HIGH (ref 0.40–4.50)

## 2021-02-09 ENCOUNTER — Telehealth (INDEPENDENT_AMBULATORY_CARE_PROVIDER_SITE_OTHER): Payer: Medicare Other | Admitting: Medical-Surgical

## 2021-02-09 ENCOUNTER — Encounter: Payer: Self-pay | Admitting: Medical-Surgical

## 2021-02-09 DIAGNOSIS — J40 Bronchitis, not specified as acute or chronic: Secondary | ICD-10-CM | POA: Diagnosis not present

## 2021-02-09 MED ORDER — BENZONATATE 200 MG PO CAPS: 200.0000 mg | ORAL_CAPSULE | Freq: Three times a day (TID) | ORAL | 0 refills | Status: DC | PRN

## 2021-02-09 MED ORDER — PREDNISONE 50 MG PO TABS: 50.0000 mg | ORAL_TABLET | Freq: Every day | ORAL | 0 refills | Status: DC

## 2021-02-09 NOTE — Progress Notes (Signed)
Virtual Visit via Video Note  I connected with Courtney Stevenson on 02/09/21 at  9:30 AM EDT by a video enabled telemedicine application and verified that I am speaking with the correct person using two identifiers.   I discussed the limitations of evaluation and management by telemedicine and the availability of in person appointments. The patient expressed understanding and agreed to proceed.  Patient location: home Provider locations: office  Subjective:    CC: Coughing, wheezing  HPI: Pleasant 80 year old female accompanied by her daughter presenting via MyChart video visit with reports of 1 week of coughing with food, clear mucus production as well as intermittent wheezing.  She initially started with some sinus congestion at the onset of her illness but this resolved after a few days.  She denies fever, chills, shortness of breath, chest pain, and GI symptoms.  Has been using DayQuil 1 teaspoon up to 2 times daily as needed which is somewhat helpful.  Has also been using albuterol inhaler as prescribed which she reports is helpful.  Notes the swelling in her lower extremities is better and is not having any worsening dyspnea with activity.  Past medical history, Surgical history, Family history not pertinant except as noted below, Social history, Allergies, and medications have been entered into the medical record, reviewed, and corrections made.   Review of Systems: See HPI for pertinent positives and negatives.   Objective:    General: Speaking clearly in complete sentences without any shortness of breath.  Alert and oriented x3.  Normal judgment. No apparent acute distress.  Impression and Recommendations:    1. Bronchitis with acute wheezing Low suspicion for cardiac etiology.  Prednisone 50 mg daily x5 days.  Tessalon Perles every 8 hours as needed for cough.  Continue DayQuil if desired.  Continue albuterol inhaler.  Recommend symptomatic treatment and regular deep breathing  exercises.  Monitor lower extremity edema for worsening.  Seek emergency/urgent care evaluation for chest pain or worsening dyspnea.  If no improvement by Friday with use of the above treatment, advised patient to contact me and we will send in azithromycin.  Patient and daughter verbalized understanding and are agreeable to the plan.  I discussed the assessment and treatment plan with the patient. The patient was provided an opportunity to ask questions and all were answered. The patient was advised to call back or seek an in-person evaluation if the symptoms worsen or if the condition fails to improve as anticipated.  20 minutes of non-face-to-face time was provided during this encounter.  Return if symptoms worsen or fail to improve.  Thayer Ohm, DNP, APRN, FNP-BC [facility]

## 2021-02-09 NOTE — Progress Notes (Signed)
Productive cough, wheezing, sinus congestion about a week

## 2021-02-20 ENCOUNTER — Other Ambulatory Visit: Payer: Self-pay | Admitting: Medical-Surgical

## 2021-03-04 ENCOUNTER — Other Ambulatory Visit: Payer: Self-pay

## 2021-03-04 ENCOUNTER — Ambulatory Visit: Payer: Medicare Other | Admitting: Orthopedic Surgery

## 2021-03-04 ENCOUNTER — Ambulatory Visit: Payer: Self-pay

## 2021-03-04 ENCOUNTER — Encounter: Payer: Self-pay | Admitting: Orthopedic Surgery

## 2021-03-04 DIAGNOSIS — M25561 Pain in right knee: Secondary | ICD-10-CM | POA: Diagnosis not present

## 2021-03-05 ENCOUNTER — Encounter: Payer: Self-pay | Admitting: Orthopedic Surgery

## 2021-03-05 MED ORDER — METHYLPREDNISOLONE ACETATE 40 MG/ML IJ SUSP
40.0000 mg | INTRAMUSCULAR | Status: AC | PRN
  Administered 2021-03-04: 40 mg via INTRA_ARTICULAR

## 2021-03-05 MED ORDER — BUPIVACAINE HCL 0.25 % IJ SOLN
4.0000 mL | INTRAMUSCULAR | Status: AC | PRN
  Administered 2021-03-04: 4 mL via INTRA_ARTICULAR

## 2021-03-05 MED ORDER — LIDOCAINE HCL 1 % IJ SOLN
5.0000 mL | INTRAMUSCULAR | Status: AC | PRN
Start: 2021-03-04 — End: 2021-03-04
  Administered 2021-03-04: 5 mL

## 2021-03-05 NOTE — Progress Notes (Signed)
Office Visit Note   Patient: Courtney Stevenson           Date of Birth: [birth year]           MRN: [id number] Visit Date: 03/04/2021 Requested by: Christen Butter, NP [address] [place],  [place] [address] PCP: Christen Butter, NP  Subjective: Chief Complaint  Patient presents with   Right Knee - Pain    HPI: Courtney Stevenson is a 80 year old patient with long history of right knee pain which has worsened over the last 3 months.  Denies any history of injury.  States her left knee is doing well.  Reports loss of mobility in the right leg due to stiffness.  She has tried Tylenol elevation Voltaren and Biofreeze.  No history of surgery in the right knee.  She has atrial fibrillation and takes aspirin and also reports diminished kidney function.  She is using a cane and has less than 1 block of walking endurance.  Pain does not wake her from sleep at night.              ROS: All systems reviewed are negative as they relate to the chief complaint within the history of present illness.  Patient denies  fevers or chills.   Assessment & Plan: Visit Diagnoses:  1. Acute pain of right knee     Plan: Impression is right knee pain with arthritis and effusion.  Plan is aspiration and injection of the knee today with preapproval for gel shot.  Due to her significant pitting edema bilateral lower extremities right worse than left along with venous stasis and I do not l think she is a great operative candidate.  Once the effects of this cortisone injection wear off we will consider gel injection.  Follow-up as needed. This patient is diagnosed with osteoarthritis of the knee(s).    Radiographs show evidence of joint space narrowing, osteophytes, subchondral sclerosis and/or subchondral cysts.  This patient has knee pain which interferes with functional and activities of daily living.    This patient has experienced inadequate response, adverse effects and/or intolerance with conservative treatments such  as acetaminophen, NSAIDS, topical creams, physical therapy or regular exercise, knee bracing and/or weight loss.   This patient has experienced inadequate response or has a contraindication to intra articular steroid injections for at least 3 months.   This patient is not scheduled to have a total knee replacement within 6 months of starting treatment with viscosupplementation.   Follow-Up Instructions: Return if symptoms worsen or fail to improve.   Orders:  Orders Placed This Encounter  Procedures   XR KNEE 3 VIEW RIGHT   No orders of the defined types were placed in this encounter.     Procedures: Large Joint Inj: R knee on 03/04/2021 7:12 AM Indications: diagnostic evaluation, joint swelling and pain Details: 18 G 1.5 in needle, superolateral approach  Arthrogram: No  Medications: 5 mL lidocaine 1 %; 40 mg methylPREDNISolone acetate 40 MG/ML; 4 mL bupivacaine 0.25 % Aspirate: 45 mL yellow Outcome: tolerated well, no immediate complications Procedure, treatment alternatives, risks and benefits explained, specific risks discussed. Consent was given by the patient. Immediately prior to procedure a time out was called to verify the correct patient, procedure, equipment, support staff and site/side marked as required. Patient was prepped and draped in the usual sterile fashion.      Clinical Data: No additional findings.  Objective: Vital Signs: There were no vitals taken for this visit.  Physical Exam:   Constitutional: Patient appears well-developed HEENT:  Head: Normocephalic Eyes:EOM are normal Neck: Normal range of motion Cardiovascular: Normal rate Pulmonary/chest: Effort normal Neurologic: Patient is alert Skin: Skin is warm Psychiatric: Patient has normal mood and affect   Ortho Exam: Ortho exam demonstrates venous stasis changes bilateral lower extremities with pitting edema right worse than left.  Both feet are mobile perfused and sensate.  Moderate  effusion right knee no effusion left knee.  Extensor mechanism is intact bilaterally.  No groin pain with internal ex rotation of each leg.  Collateral and cruciate ligaments stable on the right.  Specialty Comments:  No specialty comments available.  Imaging: XR KNEE 3 VIEW RIGHT  Result Date: 03/05/2021 AP lateral merchant radiographs right knee reviewed.  End-stage tricompartmental arthritis is present.  No acute fracture.  Joint space narrowing and sclerosis is noted.    PMFS History: Patient Active Problem List   Diagnosis Date Noted   Atrial fibrillation (HCC) 08/25/2020   Essential hypertension 06/23/2020   Hypothyroidism 06/23/2020   Past Medical History:  Diagnosis Date   A-fib (HCC)    Hypertension     Family History  Problem Relation Age of Onset   Diabetes Mother    Skin cancer Other     Past Surgical History:  Procedure Laterality Date   ABDOMINAL HYSTERECTOMY     JOINT REPLACEMENT     TONSILLECTOMY     Social History   Occupational History   Occupation: Retired  Tobacco Use   Smoking status: Never   Smokeless tobacco: Never  Vaping Use   Vaping Use: Never used  Substance and Sexual Activity   Alcohol use: Never   Drug use: Never   Sexual activity: Not Currently    Birth control/protection: Post-menopausal, Abstinence

## 2021-03-05 NOTE — Progress Notes (Signed)
Noted  

## 2021-03-14 ENCOUNTER — Other Ambulatory Visit: Payer: Self-pay

## 2021-03-14 ENCOUNTER — Emergency Department (INDEPENDENT_AMBULATORY_CARE_PROVIDER_SITE_OTHER): Payer: Medicare Other

## 2021-03-14 ENCOUNTER — Emergency Department
Admission: EM | Admit: 2021-03-14 | Discharge: 2021-03-14 | Disposition: A | Discharge status: Home / Self Care | Payer: Medicare Other | Source: Home / Self Care

## 2021-03-14 DIAGNOSIS — S51002A Unspecified open wound of left elbow, initial encounter: Secondary | ICD-10-CM

## 2021-03-14 DIAGNOSIS — W19XXXA Unspecified fall, initial encounter: Secondary | ICD-10-CM

## 2021-03-14 DIAGNOSIS — M25522 Pain in left elbow: Secondary | ICD-10-CM | POA: Diagnosis not present

## 2021-03-14 DIAGNOSIS — S42412B Displaced simple supracondylar fracture without intercondylar fracture of left humerus, initial encounter for open fracture: Secondary | ICD-10-CM | POA: Diagnosis not present

## 2021-03-14 DIAGNOSIS — M25512 Pain in left shoulder: Secondary | ICD-10-CM

## 2021-03-14 MED ORDER — CEPHALEXIN 500 MG PO CAPS: 1000.0000 mg | ORAL_CAPSULE | Freq: Two times a day (BID) | ORAL | 0 refills | Status: AC

## 2021-03-14 MED ORDER — HYDROCODONE-ACETAMINOPHEN 5-325 MG PO TABS: 1.0000 | ORAL_TABLET | Freq: Four times a day (QID) | ORAL | 0 refills | Status: AC | PRN

## 2021-03-14 NOTE — Discharge Instructions (Addendum)
Advised/instructed patient and daughter to change wound dressing daily starting tomorrow, Monday, 03/15/2021.  Encouraged patient/daughter to leave wound open is much as possible to heal by secondary intention.  Advised patient to take Keflex with food as directed.  Advised patient may use pain medication daily, as needed.  Advised patient to keep left shoulder immobilizer on until meeting with orthopedic provider on Monday, 03/15/2021.  Advised patient to follow-up with PCP or here for wound care of left skin avulsion in the next 2-3 days.

## 2021-03-14 NOTE — ED Provider Notes (Signed)
[facility]    CSN: [id number] Arrival date & time: 03/14/21  1135      History   Chief Complaint Chief Complaint  Patient presents with   Shoulder Pain   Elbow Pain   Fall    HPI Courtney Stevenson is a 80 y.o. female.   HPI 80 year old female presents with left shoulder pain and left elbow pain after falling yesterday afternoon.  Patient reports tripping and falling on tile floor and landing on left shoulder/elbow.  Patient is has skin tear to left elbow.  Patient is accompanied by her daughter who reports cleaning the area with water, hydrogen peroxide, and Polysporin or met with nonadherent dressing.  Left elbow skin avulsion is currently covered.  Past Medical History:  Diagnosis Date   A-fib [facility])    Hypertension     Patient Active Problem List   Diagnosis Date Noted   Atrial fibrillation ([place]) 08/25/2020   Essential hypertension 06/23/2020   Hypothyroidism 06/23/2020    Past Surgical History:  Procedure Laterality Date   ABDOMINAL HYSTERECTOMY     JOINT REPLACEMENT     TONSILLECTOMY      OB History   No obstetric history on file.      Home Medications    Prior to Admission medications   Medication Sig Start Date End Date Taking? Authorizing Provider  acetaminophen (TYLENOL) 325 MG tablet Take 650 mg by mouth every 6 (six) hours as needed.   Yes [provider]  albuterol (VENTOLIN HFA) 108 (90 Base) MCG/ACT inhaler Inhale 2 puffs into the lungs every 6 (six) hours as needed for wheezing. 10/06/20   Samuel Bouche, NP  aspirin EC 81 MG tablet Take 1 tablet (81 mg total) by mouth daily. 08/25/20   Samuel Bouche, NP  benzonatate (TESSALON) 200 MG capsule Take 1 capsule (200 mg total) by mouth 3 (three) times daily as needed for cough. 02/09/21   Samuel Bouche, NP  carvedilol (COREG) 6.25 MG tablet Take 1.5 tablets (9.375 mg total) by mouth 2 (two) times daily with a meal. 01/27/21 04/27/21  Samuel Bouche, NP  cephALEXin (KEFLEX) 500 MG capsule Take 2  capsules (1,000 mg total) by mouth 2 (two) times daily for 7 days. 03/14/21 03/21/21 Yes Eliezer Lofts, FNP  furosemide (LASIX) 20 MG tablet TAKE 20 MG ON MON, WED, AND FRI AFTERNOONS AS NEEDED. TAKE IN ADDITION TO THE DAILY 40MG DOSE. 02/22/21   Samuel Bouche, NP  furosemide (LASIX) 40 MG tablet Take 1 tablet (40 mg total) by mouth daily. 01/27/21   Samuel Bouche, NP  HYDROcodone-acetaminophen (NORCO) 5-325 MG tablet Take 1-2 tablets by mouth every 6 (six) hours as needed for up to 5 days for moderate pain. 03/14/21 03/19/21 Yes Eliezer Lofts, FNP  predniSONE (DELTASONE) 50 MG tablet Take 1 tablet (50 mg total) by mouth daily. 02/09/21   Samuel Bouche, NP  losartan-hydrochlorothiazide (HYZAAR) 100-12.5 MG per tablet Take 1 tablet by mouth daily.  04/13/20  [provider]    Family History Family History  Problem Relation Age of Onset   Diabetes Mother    Kidney disease Mother    Kidney disease Father    Skin cancer Other     Social History Social History   Tobacco Use   Smoking status: Never   Smokeless tobacco: Never  Vaping Use   Vaping Use: Never used  Substance Use Topics   Alcohol use: Never   Drug use: Never     Allergies   Hydrochlorothiazide and  Sulfanilamide   Review of Systems Review of Systems  Musculoskeletal:        Left shoulder/Left elbow pain x1  Skin:  Positive for wound.    Physical Exam Triage Vital Signs ED Triage Vitals  Enc Vitals Group     BP 03/14/21 1229 (!) 158/84     Pulse Rate 03/14/21 1229 90     Resp 03/14/21 1229 18     Temp 03/14/21 1229 98.4 F (36.9 C)     Temp Source 03/14/21 1229 Oral     SpO2 03/14/21 1229 96 %     Weight 03/14/21 1224 194 lb (88 kg)     Height 03/14/21 1224 5' 2"  (1.575 m)     Head Circumference --      Peak Flow --      Pain Score 03/14/21 1223 7     Pain Loc --      Pain Edu? --      Excl. in [place]? --    No data found.  Updated Vital Signs BP (!) 158/84 (BP Location: Right Arm)   Pulse 90    Temp 98.4 F (36.9 C) (Oral)   Resp 18   Ht 5' 2"  (1.575 m)   Wt 194 lb (88 kg)   SpO2 96%   BMI 35.48 kg/m   Physical Exam Vitals and nursing note reviewed.  Constitutional:      Appearance: Normal appearance. She is normal weight.  HENT:     Head: Normocephalic and atraumatic.     Mouth/Throat:     Mouth: Mucous membranes are moist.     Pharynx: Oropharynx is clear.  Eyes:     Extraocular Movements: Extraocular movements intact.     Conjunctiva/sclera: Conjunctivae normal.     Pupils: Pupils are equal, round, and reactive to light.  Cardiovascular:     Rate and Rhythm: Normal rate and regular rhythm.     Pulses: Normal pulses.     Heart sounds: Normal heart sounds.  Pulmonary:     Effort: Pulmonary effort is normal.     Breath sounds: Normal breath sounds.  Musculoskeletal:        General: Normal range of motion.  Skin:    General: Skin is warm and dry.  Neurological:     General: No focal deficit present.     Mental Status: She is alert and oriented to person, place, and time.     UC Treatments / Results  Labs (all labs ordered are listed, but only abnormal results are displayed) Labs Reviewed - No data to display  EKG   Radiology DG Elbow Complete Left  Result Date: 03/14/2021 CLINICAL DATA:  Status post fall with LEFT shoulder pain and elbow pain with skin tear. EXAM: LEFT ELBOW - COMPLETE 3+ VIEW COMPARISON:  None FINDINGS: Degenerative changes about the elbow. No sign of fracture or dislocation. Well corticated bone fragments adjacent to medial and lateral epicondyle likely from previous avulsion injury or epicondylitis in the past. No joint effusion. IMPRESSION: No acute fracture or dislocation. Degenerative changes about the elbow. Electronically Signed   By: Zetta Bills M.D.   On: 03/14/2021 13:05   DG Shoulder Left  Result Date: 03/14/2021 CLINICAL DATA:  A 80 year old female presents with shoulder pain and deformity of the shoulder. EXAM: LEFT  SHOULDER - 2+ VIEW COMPARISON:  Chest x-ray from Oct 06, 2020. FINDINGS: Markedly displaced and angulated fracture of the LEFT humeral neck with mild comminution. Humeral head appears located. Verus  angulation is noted with overlapping at the fracture site. IMPRESSION: Markedly displaced and angulated fracture of the LEFT humeral neck. Electronically Signed   By: Zetta Bills M.D.   On: 03/14/2021 13:03    Procedures Procedures (including critical care time)  Medications Ordered in UC Medications - No data to display  Initial Impression / Assessment and Plan / UC Course  I have reviewed the triage vital signs and the nursing notes.  Pertinent labs & imaging results that were available during my care of the patient were reviewed by me and considered in my medical decision making (see chart for details).     MDM: 1.  Open subcondylar fracture of left humerus, initial encounter-Rx'd Norco, patient placed in shoulder immobilizer prior to discharge; 2.  Avulsion of skin of left elbow, initial encounter-affected area dressed with wound bandages, Rx'd Keflex. Advised/instructed patient and daughter to change wound dressing daily starting tomorrow, Monday, 03/15/2021.  Encouraged patient/daughter to leave wound open is much as possible to heal by secondary intention.  Advised patient to take Keflex with food as directed.  Advised patient may use pain medication daily, as needed.  Advised patient to keep left shoulder immobilizer on until meeting with orthopedic provider on Monday, 03/15/2021.  Advised patient to follow-up with PCP or here for wound care of left skin avulsion.  Patient discharged home, hemodynamically stable. Final Clinical Impressions(s) / UC Diagnoses   Final diagnoses:  Fall, initial encounter  Acute pain of left shoulder  Open supracondylar fracture of left humerus, initial encounter  Avulsion of skin of elbow, left, initial encounter     Discharge Instructions       Advised/instructed patient and daughter to change wound dressing daily starting tomorrow, Monday, 03/15/2021.  Encouraged patient/daughter to leave wound open is much as possible to heal by secondary intention.  Advised patient to take Keflex with food as directed.  Advised patient may use pain medication daily, as needed.  Advised patient to keep left shoulder immobilizer on until meeting with orthopedic provider on Monday, 03/15/2021.  Advised patient to follow-up with PCP or here for wound care of left skin avulsion in the next 2-3 days.     ED Prescriptions     Medication Sig Dispense Auth. Provider   cephALEXin (KEFLEX) 500 MG capsule Take 2 capsules (1,000 mg total) by mouth 2 (two) times daily for 7 days. 28 capsule Eliezer Lofts, FNP   HYDROcodone-acetaminophen (NORCO) 5-325 MG tablet Take 1-2 tablets by mouth every 6 (six) hours as needed for up to 5 days for moderate pain. 30 tablet Eliezer Lofts, FNP      I have reviewed the PDMP during this encounter.   Eliezer Lofts, [place] 03/14/21 1344

## 2021-03-14 NOTE — ED Triage Notes (Signed)
Pt presents to Urgent Care with c/o L shoulder and L elbow pain following a fall yesterday afternoon. Pt reports tripping and falling to a tile floor, landing on L shoulder/elbow. Skin tear reported to L elbow--daughter reports wound was cleaned w/ H2O2 and polysporin ointment w/ nonadherent dressing applied. Elbow is currently wrapped and some blood noted on dressing. L shoulder is swollen.

## 2021-03-17 ENCOUNTER — Other Ambulatory Visit: Payer: Self-pay

## 2021-03-17 ENCOUNTER — Ambulatory Visit: Payer: Medicare Other | Admitting: Orthopedic Surgery

## 2021-03-17 DIAGNOSIS — M1711 Unilateral primary osteoarthritis, right knee: Secondary | ICD-10-CM

## 2021-03-17 DIAGNOSIS — S42292A Other displaced fracture of upper end of left humerus, initial encounter for closed fracture: Secondary | ICD-10-CM

## 2021-03-17 DIAGNOSIS — M25561 Pain in right knee: Secondary | ICD-10-CM

## 2021-03-20 ENCOUNTER — Encounter: Payer: Self-pay | Admitting: Orthopedic Surgery

## 2021-03-20 NOTE — Progress Notes (Signed)
Office Visit Note   Patient: Courtney Stevenson           Date of Birth: [birth year]           MRN: [id number] Visit Date: 03/17/2021 Requested by: Christen Butter, NP [address] [place],  [place] [address] PCP: Christen Butter, NP  Subjective: Chief Complaint  Patient presents with   Left Shoulder - Fracture    DOI 03/13/21    HPI: Courtney Stevenson is an 80 year old patient with left shoulder pain.  She fell and landed on her left side about 3 or 4 days ago.  She had an abrasion over the left elbow.  No prior surgery or injury to the left upper extremity.  She also has significant right knee pain.  She had an injection in that right knee several weeks ago which gave her relief.  She would like to consider gel injection as a next step.  She is not considering knee replacement.              ROS: All systems reviewed are negative as they relate to the chief complaint within the history of present illness.  Patient denies  fevers or chills.   Assessment & Plan: Visit Diagnoses:  1. Acute pain of right knee   2. Arthritis of right knee   3. Other closed displaced fracture of proximal end of left humerus, initial encounter     Plan: Impression is displaced left proximal humerus fracture in an 80 year old patient.  She has good function of the right shoulder.  Given that this fracture is displaced I think it is possible that it could heal.  Plan at this time would be observation with sling immobilization for the next 2-1/2 weeks with initiation of pendulum exercises at that time.  Would also be good to preapproved her for gel injection in the right knee.  Follow-up in 2 and half weeks with repeat radiographs and clinical examination to determine if the fracture is indeed moving as a unit.  Follow-Up Instructions: No follow-ups on file.   Orders:  No orders of the defined types were placed in this encounter.  No orders of the defined types were placed in this encounter.      Procedures: No procedures performed   Clinical Data: No additional findings.  Objective: Vital Signs: There were no vitals taken for this visit.  Physical Exam:   Constitutional: Patient appears well-developed HEENT:  Head: Normocephalic Eyes:EOM are normal Neck: Normal range of motion Cardiovascular: Normal rate Pulmonary/chest: Effort normal Neurologic: Patient is alert Skin: Skin is warm Psychiatric: Patient has normal mood and affect   Ortho Exam: Ortho exam demonstrates palpable radial pulse on the right left-hand side.  Deltoid dysfunction.  Swelling ecchymosis and bruising present in the left proximal humeral region.  Right knee has trace effusion and intact extensor mechanism.  Specialty Comments:  No specialty comments available.  Imaging: No results found.   PMFS History: Patient Active Problem List   Diagnosis Date Noted   Atrial fibrillation (HCC) 08/25/2020   Essential hypertension 06/23/2020   Hypothyroidism 06/23/2020   Past Medical History:  Diagnosis Date   A-fib (HCC)    Hypertension     Family History  Problem Relation Age of Onset   Diabetes Mother    Kidney disease Mother    Kidney disease Father    Skin cancer Other     Past Surgical History:  Procedure Laterality Date   ABDOMINAL HYSTERECTOMY  JOINT REPLACEMENT     TONSILLECTOMY     Social History   Occupational History   Occupation: Retired  Tobacco Use   Smoking status: Never   Smokeless tobacco: Never  Vaping Use   Vaping Use: Never used  Substance and Sexual Activity   Alcohol use: Never   Drug use: Never   Sexual activity: Not Currently    Birth control/protection: Post-menopausal, Abstinence

## 2021-03-22 ENCOUNTER — Telehealth: Payer: Self-pay

## 2021-03-22 NOTE — Telephone Encounter (Signed)
-----   Message from Prescott Parma, RT sent at 03/22/2021  8:41 AM EDT ----- Regarding: needs prior auth for gel injection See note from Dr. August Saucer. Thanks.  ----- Message ----- From: Cammy Copa, MD Sent: 03/20/2021   9:41 AM EDT To: Prescott Parma, RT  Hi Lauren can you get Brayton Layman for gel injection right knee.  Thank you

## 2021-03-22 NOTE — Telephone Encounter (Signed)
VOB submitted for Durolane, right knee. Pending BV. 

## 2021-03-22 NOTE — Telephone Encounter (Signed)
Noted  

## 2021-03-23 ENCOUNTER — Telehealth: Payer: Self-pay

## 2021-03-23 NOTE — Telephone Encounter (Signed)
Approved for Durolane, Right knee. Buy & Bill Patient will be responsible for 20% OOP. Co-pay of $35.00 No PA required  Appt. 04/09/2021 with Dr. August Saucer

## 2021-04-09 ENCOUNTER — Other Ambulatory Visit: Payer: Self-pay

## 2021-04-09 ENCOUNTER — Telehealth: Payer: Self-pay | Admitting: Radiology

## 2021-04-09 ENCOUNTER — Encounter: Payer: Self-pay | Admitting: Orthopedic Surgery

## 2021-04-09 ENCOUNTER — Ambulatory Visit: Payer: Medicare Other | Admitting: Orthopedic Surgery

## 2021-04-09 ENCOUNTER — Ambulatory Visit: Payer: Self-pay

## 2021-04-09 DIAGNOSIS — M1712 Unilateral primary osteoarthritis, left knee: Secondary | ICD-10-CM | POA: Diagnosis not present

## 2021-04-09 DIAGNOSIS — S42292A Other displaced fracture of upper end of left humerus, initial encounter for closed fracture: Secondary | ICD-10-CM

## 2021-04-09 MED ORDER — SODIUM HYALURONATE 60 MG/3ML IX PRSY
60.0000 mg | PREFILLED_SYRINGE | INTRA_ARTICULAR | Status: AC | PRN
  Administered 2021-04-09: 60 mg via INTRA_ARTICULAR

## 2021-04-09 MED ORDER — LIDOCAINE HCL 1 % IJ SOLN
5.0000 mL | INTRAMUSCULAR | Status: AC | PRN
Start: 2021-04-09 — End: 2021-04-09
  Administered 2021-04-09: 5 mL

## 2021-04-09 NOTE — Telephone Encounter (Signed)
HHPT/HHOT order faxed to [phone] per patient's request.  Assist with gait training, ADL's Left shoulder PROM, pendulums only 3x/wk x 2 weekd Right knee ROM and strengthening

## 2021-04-09 NOTE — Progress Notes (Signed)
Office Visit Note   Patient: Courtney Stevenson           Date of Birth: [birth year]           MRN: [id number] Visit Date: 04/09/2021 Requested by: Christen Butter, NP [address] [place],  [place] [address] PCP: Christen Butter, NP  Subjective: Chief Complaint  Patient presents with   Left Shoulder - Fracture, Follow-up   Right Knee - Pain    HPI: Courtney Stevenson is an 80 year old patient presents for follow-up left shoulder fracture.  She has been doing reasonably well.  On exam the humerus moves as a unit.  Motion is predictably limited.  Deltoid does fire.  Plan at this time is home health physical therapy for passive range of motion and pendulums only 3 times a week for 2 weeks.  Come back for clinical recheck in 3 weeks.  Okay to spend time out of the sling but no lifting anything with that left arm.  Right knee also injected today with Durolane.              ROS: See above  Assessment & Plan: Visit Diagnoses:  1. Other closed displaced fracture of proximal end of left humerus, initial encounter     Plan: See above  Follow-Up Instructions: Return in about 3 weeks (around 04/30/2021).   Orders:  Orders Placed This Encounter  Procedures   XR Shoulder Left   No orders of the defined types were placed in this encounter.     Procedures: Large Joint Inj: R knee on 04/09/2021 9:44 PM Indications: diagnostic evaluation, joint swelling and pain Details: 18 G 1.5 in needle, superolateral approach  Arthrogram: No  Medications: 5 mL lidocaine 1 %; 60 mg Sodium Hyaluronate 60 MG/3ML Outcome: tolerated well, no immediate complications Procedure, treatment alternatives, risks and benefits explained, specific risks discussed. Consent was given by the patient. Immediately prior to procedure a time out was called to verify the correct patient, procedure, equipment, support staff and site/side marked as required. Patient was prepped and draped in the usual sterile fashion.       Clinical Data: No additional findings.  Objective: Vital Signs: There were no vitals taken for this visit.  Physical Exam: See above  Ortho Exam: See above  Specialty Comments:  No specialty comments available.  Imaging: XR Shoulder Left  Result Date: 04/09/2021 Multiple radiographic views proximal left humerus obtained.  Displaced proximal humerus fracture through the neck shaft region is again observed with anterior displacement of the shaft by approximately 50% head width.  There is still bony contact between the 2 pieces.  Mild bony callus is present but the responses not robust    PMFS History: Patient Active Problem List   Diagnosis Date Noted   Atrial fibrillation (HCC) 08/25/2020   Essential hypertension 06/23/2020   Hypothyroidism 06/23/2020   Past Medical History:  Diagnosis Date   A-fib (HCC)    Hypertension     Family History  Problem Relation Age of Onset   Diabetes Mother    Kidney disease Mother    Kidney disease Father    Skin cancer Other     Past Surgical History:  Procedure Laterality Date   ABDOMINAL HYSTERECTOMY     JOINT REPLACEMENT     TONSILLECTOMY     Social History   Occupational History   Occupation: Retired  Tobacco Use   Smoking status: Never   Smokeless tobacco: Never  Vaping Use   Vaping Use:  Never used  Substance and Sexual Activity   Alcohol use: Never   Drug use: Never   Sexual activity: Not Currently    Birth control/protection: Post-menopausal, Abstinence

## 2021-04-12 ENCOUNTER — Telehealth: Payer: Self-pay | Admitting: Orthopedic Surgery

## 2021-04-12 NOTE — Telephone Encounter (Signed)
IC advised ok.  

## 2021-04-12 NOTE — Telephone Encounter (Signed)
Shalyn from [facility] called asking to start orders on 04/20/21. Please call Shalyn at [phone]

## 2021-04-21 ENCOUNTER — Telehealth: Payer: Self-pay | Admitting: Orthopedic Surgery

## 2021-04-21 NOTE — Telephone Encounter (Signed)
Please see message below. Patient is currently being treated for Other closed displaced fracture of proximal end of left humerus

## 2021-04-21 NOTE — Telephone Encounter (Signed)
That's okay for that frequency and duration.  Focus on PROM and pendulums for now, no active ROM or lifting/strengthening

## 2021-04-21 NOTE — Telephone Encounter (Signed)
HH P.T calling on behalf of the pt to get verbal orders. Asking for 2 times per week for 4 weeks then 1 time per week for 6 weeks. The best call back number is [phone] and it is a secure line if vm needs to be left.

## 2021-04-23 NOTE — Telephone Encounter (Signed)
Home health has been made aware. No further questions or concerns.

## 2021-04-30 ENCOUNTER — Other Ambulatory Visit: Payer: Self-pay

## 2021-04-30 ENCOUNTER — Ambulatory Visit: Payer: Medicare Other | Admitting: Surgical

## 2021-04-30 ENCOUNTER — Ambulatory Visit: Payer: Self-pay

## 2021-04-30 ENCOUNTER — Encounter: Payer: Self-pay | Admitting: Surgical

## 2021-04-30 DIAGNOSIS — S42292A Other displaced fracture of upper end of left humerus, initial encounter for closed fracture: Secondary | ICD-10-CM

## 2021-05-18 ENCOUNTER — Telehealth: Payer: Self-pay | Admitting: Orthopedic Surgery

## 2021-05-18 NOTE — Telephone Encounter (Signed)
Nynica with [facility] called stating she needs a script for a rolling walker faxed to their office and she would like a CB to be notified when that's done.   CB# [phone] Fax# [phone]

## 2021-05-18 NOTE — Telephone Encounter (Signed)
Faxed

## 2021-05-18 NOTE — Telephone Encounter (Signed)
Called her to let her know this was faxed.

## 2021-05-28 ENCOUNTER — Ambulatory Visit: Payer: Medicare Other | Admitting: Surgical

## 2021-05-28 ENCOUNTER — Other Ambulatory Visit: Payer: Self-pay

## 2021-05-28 ENCOUNTER — Ambulatory Visit: Payer: Medicare Other | Admitting: Medical-Surgical

## 2021-05-28 ENCOUNTER — Ambulatory Visit: Payer: Self-pay

## 2021-05-28 DIAGNOSIS — S42292A Other displaced fracture of upper end of left humerus, initial encounter for closed fracture: Secondary | ICD-10-CM

## 2021-05-28 MED ORDER — LIDOCAINE 4 % EX PTCH: MEDICATED_PATCH | CUTANEOUS | 0 refills | Status: DC

## 2021-05-30 ENCOUNTER — Encounter: Payer: Self-pay | Admitting: Surgical

## 2021-05-30 NOTE — Progress Notes (Signed)
° °  Post-Op Visit Note   Patient: Courtney Stevenson           Date of Birth: 1941-03-30           MRN: [id number] Visit Date: 05/28/2021 PCP: Christen Butter, NP   Assessment & Plan:  Chief Complaint:  Chief Complaint  Patient presents with   Left Shoulder - Follow-up   Visit Diagnoses: No diagnosis found.  Plan: Patient is a 81 year old female who returns for follow-up of left shoulder proximal humerus fracture.  She is doing well and feels pain is progressing slowly but surely.  She is working with home health physical therapy focusing on passive and active range of motion at this point.  Not lift anything with the arm and not doing any strengthening exercises.  She is able to sleep well at night.  Not using the sling any longer.  She has had previous Durolane injection in the right knee and states that this helped but she still having some occasional right knee pain.  On exam, the left shoulder proximal humerus fracture is moving as 1 unit.  Axillary nerve intact with deltoid firing.  No deformity to inspection.  Patient has range of motion passively with 30 degrees external rotation, 80 degrees abduction, 60 degrees forward flexion.  Active range of motion roughly equivalent to passive range of motion.  4/5 external rotation strength.  5 -/5 supraspinatus strength.  5 -/5 subscapularis strength.  Plan is continue with home health physical therapy but okay to start full active range of motion of the left shoulder and some light rotator cuff strengthening exercises at this time.  Also recommended home health physical therapy exercises for the knee focusing on nonloadbearing quadricep strengthening exercises.  Prescribe lidocaine patches for symptomatic relief of right knee and left shoulder pain but she will not use 2 patches at once.  Follow-up in 6 weeks for clinical recheck with Dr. August Saucer.  Follow-Up Instructions: No follow-ups on file.   Orders:  No orders of the defined types were placed in  this encounter.  Meds ordered this encounter  Medications   Lidocaine (HM LIDOCAINE PATCH) 4 % PTCH    Sig: Apply to affected area 12 hrs on and 12 hrs off    Dispense:  30 patch    Refill:  0    Imaging: No results found.  PMFS History: Patient Active Problem List   Diagnosis Date Noted   Atrial fibrillation (HCC) 08/25/2020   Essential hypertension 06/23/2020   Hypothyroidism 06/23/2020   Past Medical History:  Diagnosis Date   A-fib (HCC)    Hypertension     Family History  Problem Relation Age of Onset   Diabetes Mother    Kidney disease Mother    Kidney disease Father    Skin cancer Other     Past Surgical History:  Procedure Laterality Date   ABDOMINAL HYSTERECTOMY     JOINT REPLACEMENT     TONSILLECTOMY     Social History   Occupational History   Occupation: Retired  Tobacco Use   Smoking status: Never   Smokeless tobacco: Never  Vaping Use   Vaping Use: Never used  Substance and Sexual Activity   Alcohol use: Never   Drug use: Never   Sexual activity: Not Currently    Birth control/protection: Post-menopausal, Abstinence

## 2021-05-30 NOTE — Progress Notes (Signed)
° °  Established patient office visit  HPI with pertinent ROS:   CC: chronic disease follow up  HPI: Courtney Stevenson 81 year old female presenting today for the following:  A fib/HTN- taking Coreg 9.375mg  BID, tolerating well without significant side effects. Also taking ASA 81mg  daily. Taking furosemide 40mg  daily with the occasional use of 20mg  PRN for swelling. Has had home PT after her fall and they check her BP at home with good results although it is a little higher here today. Denies CP, SOB, palpitations, dizziness, headaches, or vision changes.  Hypothyroidism- last TSH slightly elevated but other thyroid testing normal. No medications at this point. Denies hair loss, skin/nail changes, heat/cold intolerance, palpitations, weight fluctuations, mood changes, and bowel habit changes.   Under the care of ortho for her fall and the associated left humerus fracture. Feels that she is doing well and is working regularly with PT to get her ROM and strength back.   Her daughter has noted a vaginal protrusion when she is helping her mom wash in the shower. She isn't sure if this is the bladder or something else. Patient denies urinary symptoms and changes in bowel habits. Partial hysterectomy in the past. Not followed by GYN.   I reviewed the past medical history, family history, social history, surgical history, and allergies today and no changes were needed.  Please see the problem list section below in epic for further details.   Brief exam, Assessment, and Plan:    1. Atrial fibrillation, unspecified type (HCC) 2. Essential hypertension On exam, heart rate regular, normal rhythm with no murmurs, gallops, rubs, or ectopic beats. Mild lower extremity edema remains. Right knee swelling noted. Lungs CTA, normal respirations at rest. Checking CMP today. Continue Coreg 9.375mg  BID as prescribed. Continue Furosemide 40mg  daily with prn 20mg  dose for significant swelling.  - COMPLETE METABOLIC PANEL WITH  GFR  3. Acquired hypothyroidism Checking TSH today.  - TSH  4. Other closed displaced fracture of proximal end of left humerus with routine healing, subsequent encounter Managed by ortho.   5. Vaginal mass Patient very embarrassed by this issue and was hesitant for an exam due to embarrassment and mobility concerns with getting on the exam table. After discussion, feel this may be a cystocele but could also be a rectocele. Recommend evaluation to determine the etiology of the protrusion so we know how to go about treating it. Plan made to refer to GYN for further evaluation as they have exam tables lower to the ground and will be able to fully examine the issue.  - Ambulatory referral to Gynecology  Return in about 3 months (around 08/29/2021) for chronic disease follow up. ___________________________________________ , DNP, APRN, FNP-BC Primary Care and Sports Medicine [facility] [place]

## 2021-05-31 ENCOUNTER — Encounter: Payer: Self-pay | Admitting: Medical-Surgical

## 2021-05-31 ENCOUNTER — Other Ambulatory Visit: Payer: Self-pay

## 2021-05-31 ENCOUNTER — Ambulatory Visit (INDEPENDENT_AMBULATORY_CARE_PROVIDER_SITE_OTHER): Payer: Medicare Other | Admitting: Medical-Surgical

## 2021-05-31 VITALS — BP 141/91 | HR 77 | Resp 20 | Ht 62.0 in | Wt 196.0 lb

## 2021-05-31 DIAGNOSIS — I10 Essential (primary) hypertension: Secondary | ICD-10-CM

## 2021-05-31 DIAGNOSIS — N898 Other specified noninflammatory disorders of vagina: Secondary | ICD-10-CM

## 2021-05-31 DIAGNOSIS — E039 Hypothyroidism, unspecified: Secondary | ICD-10-CM | POA: Diagnosis not present

## 2021-05-31 DIAGNOSIS — I4891 Unspecified atrial fibrillation: Secondary | ICD-10-CM | POA: Diagnosis not present

## 2021-05-31 DIAGNOSIS — S42292D Other displaced fracture of upper end of left humerus, subsequent encounter for fracture with routine healing: Secondary | ICD-10-CM

## 2021-06-01 ENCOUNTER — Telehealth: Payer: Self-pay | Admitting: Orthopedic Surgery

## 2021-06-01 ENCOUNTER — Encounter: Payer: Self-pay | Admitting: Medical-Surgical

## 2021-06-01 NOTE — Telephone Encounter (Signed)
Nynica with physical therapy has been made aware.

## 2021-06-01 NOTE — Telephone Encounter (Signed)
Okay by me.

## 2021-06-01 NOTE — Telephone Encounter (Signed)
Please see message below. Patient is being treated for left shoulder proximal humerus fracture.

## 2021-06-01 NOTE — Telephone Encounter (Signed)
Nynica with [facility] called with continued verbal orders with the frequency twice a week for 2 weeks. She would like a CB to have this approved.  CB# [phone]

## 2021-06-02 LAB — T3, FREE: T3, Free: 3.4 pg/mL (ref 2.3–4.2)

## 2021-06-02 LAB — COMPLETE METABOLIC PANEL WITH GFR
AG Ratio: 1.3 (calc) (ref 1.0–2.5)
ALT: 13 U/L (ref 6–29)
AST: 16 U/L (ref 10–35)
Albumin: 3.7 g/dL (ref 3.6–5.1)
Alkaline phosphatase (APISO): 98 U/L (ref 37–153)
BUN/Creatinine Ratio: 19 (calc) (ref 6–22)
BUN: 20 mg/dL (ref 7–25)
CO2: 28 mmol/L (ref 20–32)
Calcium: 9.3 mg/dL (ref 8.6–10.4)
Chloride: 100 mmol/L (ref 98–110)
Creat: 1.07 mg/dL — ABNORMAL HIGH (ref 0.60–0.95)
Globulin: 2.8 g/dL (calc) (ref 1.9–3.7)
Glucose, Bld: 90 mg/dL (ref 65–139)
Potassium: 4.4 mmol/L (ref 3.5–5.3)
Sodium: 137 mmol/L (ref 135–146)
Total Bilirubin: 0.6 mg/dL (ref 0.2–1.2)
Total Protein: 6.5 g/dL (ref 6.1–8.1)
eGFR: 53 mL/min/{1.73_m2} — ABNORMAL LOW (ref >=60)

## 2021-06-02 LAB — TSH: TSH: 6.06 mIU/L — ABNORMAL HIGH (ref 0.40–4.50)

## 2021-07-09 ENCOUNTER — Ambulatory Visit: Payer: Medicare Other | Admitting: Surgical

## 2021-07-09 ENCOUNTER — Other Ambulatory Visit: Payer: Self-pay

## 2021-07-09 ENCOUNTER — Telehealth: Payer: Self-pay

## 2021-07-09 ENCOUNTER — Ambulatory Visit: Payer: Self-pay

## 2021-07-09 DIAGNOSIS — S42292A Other displaced fracture of upper end of left humerus, initial encounter for closed fracture: Secondary | ICD-10-CM

## 2021-07-09 NOTE — Telephone Encounter (Signed)
Auth needed for right knee gel injection. Last injection 04/09/21 Please call patient to schedule

## 2021-07-09 NOTE — Telephone Encounter (Signed)
Noted.   Will submit at the end of Courtney Stevenson, 2023 due to next available gel injection needing to be after 10/07/2021.

## 2021-07-10 ENCOUNTER — Encounter: Payer: Self-pay | Admitting: Surgical

## 2021-07-10 NOTE — Progress Notes (Signed)
Office Visit Note   Patient: Courtney Stevenson           Date of Birth: 03-29-1941           MRN: [id number] Visit Date: 07/09/2021 Requested by: Samuel Bouche, NP [address] [place],  [place] [address] PCP: Samuel Bouche, NP  Subjective: Chief Complaint  Patient presents with   Left Shoulder - Follow-up    HPI: Courtney Stevenson is a 81 y.o. female who presents to the office complaining of left shoulder and right knee pains.  Patient returns for follow-up of left shoulder proximal humerus fracture.  She has finished working with PT and feels that she has made great strides in regards to her range of motion.  She still has difficulty primarily with lifting things out and away from her body but she feels functional with the left arm and pain continues to improve.  She does not really have to take any medication for pain in her shoulder.  She sleeps well at night.  Her main complaint today is actually her right knee pain.  She has had previous Durolane injection on 04/09/2021 that provided about 2 months of relief but now pain is returning.  She has had cortisone injections in the past which only lasted for about a day..                ROS: All systems reviewed are negative as they relate to the chief complaint within the history of present illness.  Patient denies fevers or chills.  Assessment & Plan: Visit Diagnoses:  1. Other closed displaced fracture of proximal end of left humerus, initial encounter     Plan: Patient is an 81 year old female who presents for repeat evaluation of left shoulder pain and right knee pain.  She sustained left proximal humerus fracture in October 2022.  She has been performing physical therapy with the left arm and overall feels very functional.  Not really much in the way of pain with the left shoulder.  She has made great strides in regards to her range of motion though it is still somewhat limited and she understands that this will be a functional  but not perfect shoulder for the rest of her life.  Her main complaint today is right knee pain with history of right knee osteoarthritis.  Cortisone injections have not helped her in the past.  Discussed the options available to her including home exercise program (she has done physical therapy and feels this was somewhat helpful), repeat gel injection, over-the-counter medications, knee replacement.  Discussed the recovery timeframe after knee replacement as well as the risks and benefits of the procedure including the risk of nerve/vessel damage, knee stiffness, knee instability, medical complication from surgery such as DVT/PE/MI/stroke/death, need for revision surgery, periprosthetic joint infection.  After lengthy discussion, patient does not wish to proceed with surgery and she would like to try the gel injection as this provided her good relief in the past.  She will be preapproved for right knee gel injection and follow-up with the office 6 months from her last gel injection date which was 04/09/2021 for repeat injection.  Call the office in the meantime with any questions or concerns but otherwise follow-up as needed regarding the left shoulder.  This patient is diagnosed with osteoarthritis of the knee(s).    Radiographs show evidence of joint space narrowing, osteophytes, subchondral sclerosis and/or subchondral cysts.  This patient has knee pain which interferes with functional and  activities of daily living.    This patient has experienced inadequate response, adverse effects and/or intolerance with conservative treatments such as acetaminophen, NSAIDS, topical creams, physical therapy or regular exercise, knee bracing and/or weight loss.   This patient has experienced inadequate response or has a contraindication to intra articular steroid injections for at least 3 months.   This patient is not scheduled to have a total knee replacement within 6 months of starting treatment with  viscosupplementation.   Follow-Up Instructions: No follow-ups on file.   Orders:  Orders Placed This Encounter  Procedures   XR Shoulder Left   No orders of the defined types were placed in this encounter.     Procedures: No procedures performed   Clinical Data: No additional findings.  Objective: Vital Signs: There were no vitals taken for this visit.  Physical Exam:  Constitutional: Patient appears well-developed HEENT:  Head: Normocephalic Eyes:EOM are normal Neck: Normal range of motion Cardiovascular: Normal rate Pulmonary/chest: Effort normal Neurologic: Patient is alert Skin: Skin is warm Psychiatric: Patient has normal mood and affect  Ortho Exam: Ortho exam demonstrates right knee with intact extension and flexion.  Diffuse swelling throughout the right lower extremity consistent with lymphedema.  Tenderness over the medial and lateral joint lines of the right knee moderately.  No knee effusion noted today.  Range of motion of the left shoulder with 30 degrees external rotation, 80 degrees abduction, 90 degrees forward flexion.  Fracture seems to be moving as 1 unit with passive motion of the shoulder.  Axillary nerve is intact with deltoid firing.  Active range of motion equivalent to passive range of motion and improved compared with last exam.  4/5 external rotation strength.  5 -/5 supraspinatus strength.  5 -/5 subscapularis strength.  Specialty Comments:  No specialty comments available.  Imaging: No results found.   PMFS History: Patient Active Problem List   Diagnosis Date Noted   Atrial fibrillation ([place]) 08/25/2020   Essential hypertension 06/23/2020   Hypothyroidism 06/23/2020   Past Medical History:  Diagnosis Date   A-fib ([place])    Hypertension     Family History  Problem Relation Age of Onset   Diabetes Mother    Kidney disease Mother    Kidney disease Father    Skin cancer Other     Past Surgical History:  Procedure Laterality  Date   ABDOMINAL HYSTERECTOMY     JOINT REPLACEMENT     TONSILLECTOMY     Social History   Occupational History   Occupation: Retired  Tobacco Use   Smoking status: Never   Smokeless tobacco: Never  Vaping Use   Vaping Use: Never used  Substance and Sexual Activity   Alcohol use: Never   Drug use: Never   Sexual activity: Not Currently    Birth control/protection: Post-menopausal, Abstinence

## 2021-07-12 ENCOUNTER — Ambulatory Visit: Payer: Medicare Other | Admitting: Family Medicine

## 2021-07-12 ENCOUNTER — Other Ambulatory Visit: Payer: Self-pay

## 2021-07-12 ENCOUNTER — Encounter: Payer: Self-pay | Admitting: Family Medicine

## 2021-07-12 VITALS — Ht 62.0 in | Wt 193.0 lb

## 2021-07-12 DIAGNOSIS — N993 Prolapse of vaginal vault after hysterectomy: Secondary | ICD-10-CM | POA: Insufficient documentation

## 2021-07-12 NOTE — Progress Notes (Signed)
° °  Subjective:    Patient ID: Courtney Stevenson is a 81 y.o. female presenting with New GYN  on 07/12/2021  HPI: Large bulge in vaginal area. S/p TVH 40+ years ago due to bleeding. SVD x 1. Notes inability to keep things clean. Denies incontinence.  Review of Systems  Constitutional:  Negative for chills and fever.  Respiratory:  Negative for shortness of breath.   Cardiovascular:  Negative for chest pain.  Gastrointestinal:  Negative for abdominal pain, nausea and vomiting.  Genitourinary:  Negative for dysuria.  Skin:  Negative for rash.     Objective:    Ht 5\' 2"  (1.575 m)    Wt 193 lb (87.5 kg)    BMI 35.30 kg/m  Physical Exam Exam conducted with a chaperone present.  Constitutional:      General: She is not in acute distress.    Appearance: She is well-developed.  HENT:     Head: Normocephalic and atraumatic.  Eyes:     General: No scleral icterus. Cardiovascular:     Rate and Rhythm: Normal rate.  Pulmonary:     Effort: Pulmonary effort is normal.  Abdominal:     Palpations: Abdomen is soft.  Genitourinary:    Comments: Entire vaginal vault prolapse to +5 Musculoskeletal:     Cervical back: Neck supple.  Skin:    General: Skin is warm and dry.  Neurological:     Mental Status: She is alert and oriented to person, place, and time.     Pessary fitting Vaginal vault placed inside vagina.  Fitted with #5 Ring with support. Patient reported that this felt well.        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        Return in about 4 weeks (around 08/09/2021) for pessary placement.  08/11/2021, MD 07/12/2021 10:47 AM

## 2021-07-12 NOTE — Progress Notes (Signed)
GYN History of partial hysterectomy, states "I still have my ovaries" Concern for prolapsed bladder

## 2021-07-12 NOTE — Assessment & Plan Note (Signed)
Will order pessary will return for placement, and then for cleaning q 3 months.

## 2021-07-30 ENCOUNTER — Telehealth: Payer: Self-pay | Admitting: *Deleted

## 2021-07-30 NOTE — Telephone Encounter (Signed)
Pessary is not in yet, will call to reschedule when it arrives. Please call daughter for scheduling, inform patient of appointment after scheduling with daughter. ?

## 2021-08-02 ENCOUNTER — Ambulatory Visit: Payer: Medicare Other | Admitting: Family Medicine

## 2021-08-24 DIAGNOSIS — H25813 Combined forms of age-related cataract, bilateral: Secondary | ICD-10-CM | POA: Insufficient documentation

## 2021-08-24 DIAGNOSIS — H353231 Exudative age-related macular degeneration, bilateral, with active choroidal neovascularization: Secondary | ICD-10-CM | POA: Insufficient documentation

## 2021-08-25 ENCOUNTER — Other Ambulatory Visit: Payer: Self-pay | Admitting: Medical-Surgical

## 2021-08-25 ENCOUNTER — Encounter: Payer: Self-pay | Admitting: Orthopedic Surgery

## 2021-09-02 ENCOUNTER — Encounter: Payer: Self-pay | Admitting: Obstetrics and Gynecology

## 2021-09-02 ENCOUNTER — Ambulatory Visit: Payer: Medicare Other | Admitting: Obstetrics and Gynecology

## 2021-09-02 VITALS — BP 184/114 | HR 102 | Ht 62.0 in | Wt 193.0 lb

## 2021-09-02 DIAGNOSIS — Z4689 Encounter for fitting and adjustment of other specified devices: Secondary | ICD-10-CM | POA: Diagnosis not present

## 2021-09-02 DIAGNOSIS — N819 Female genital prolapse, unspecified: Secondary | ICD-10-CM | POA: Diagnosis not present

## 2021-09-02 DIAGNOSIS — N8110 Cystocele, unspecified: Secondary | ICD-10-CM | POA: Diagnosis not present

## 2021-09-02 NOTE — Progress Notes (Signed)
? ?GYNECOLOGY OFFICE NOTE ? ?History:  ?81 y.o. G1P1001 here today for pessary insertion due to vaginal vault prolapse. ? ?Past Medical History:  ?Diagnosis Date  ? A-fib (HCC)   ? Chronic kidney disease   ? Hypertension   ? ? ?Past Surgical History:  ?Procedure Laterality Date  ? ABDOMINAL HYSTERECTOMY    ? JOINT REPLACEMENT    ? TONSILLECTOMY    ? ? ? ?Current Outpatient Medications:  ?  acetaminophen (TYLENOL) 325 MG tablet, Take 650 mg by mouth every 6 (six) hours as needed., Disp: , Rfl:  ?  albuterol (VENTOLIN HFA) 108 (90 Base) MCG/ACT inhaler, Inhale 2 puffs into the lungs every 6 (six) hours as needed for wheezing., Disp: 2 each, Rfl: 11 ?  [facility] ASPIRIN LOW DOSE 81 MG EC tablet, TAKE 1 TABLET BY MOUTH EVERY DAY, Disp: 90 tablet, Rfl: 3 ?  furosemide (LASIX) 20 MG tablet, TAKE 20 MG ON MON, WED, AND FRI AFTERNOONS AS NEEDED. TAKE IN ADDITION TO THE DAILY 40MG  DOSE., Disp: 90 tablet, Rfl: 1 ?  furosemide (LASIX) 40 MG tablet, TAKE 1 TABLET BY MOUTH EVERY DAY, Disp: 90 tablet, Rfl: 1 ?  Lidocaine (HM LIDOCAINE PATCH) 4 % PTCH, Apply to affected area 12 hrs on and 12 hrs off, Disp: 30 patch, Rfl: 0 ?  benzonatate (TESSALON) 200 MG capsule, Take 1 capsule (200 mg total) by mouth 3 (three) times daily as needed for cough. (Patient not taking: Reported on 09/02/2021), Disp: 45 capsule, Rfl: 0 ?  carvedilol (COREG) 6.25 MG tablet, Take 1.5 tablets (9.375 mg total) by mouth 2 (two) times daily with a meal., Disp: 270 tablet, Rfl: 1 ? ?The following portions of the patient's history were reviewed and updated as appropriate: allergies, current medications, past family history, past medical history, past social history, past surgical history and problem list.  ? ?Review of Systems:  ?Pertinent items noted in HPI and remainder of comprehensive ROS otherwise negative. ?  ?Objective:  ?Physical Exam ?BP (!) 184/114   Pulse (!) 102   Ht 5\' 2"  (1.575 m)   Wt 193 lb (87.5 kg)   BMI 35.30 kg/m?  ?CONSTITUTIONAL:  Well-developed, well-nourished female in no acute distress.  ?HENT:  Normocephalic, atraumatic. External right and left ear normal. Oropharynx is clear and moist ?EYES: Conjunctivae and EOM are normal. Pupils are equal, round, and reactive to light. No scleral icterus.  ?NECK: Normal range of motion, supple, no masses ?SKIN: Skin is warm and dry. No rash noted. Not diaphoretic. No erythema. No pallor. ?NEUROLOGIC: Alert and oriented to person, place, and time. Normal reflexes, muscle tone coordination. No cranial nerve deficit noted. ?PSYCHIATRIC: Normal mood and affect. Normal behavior. Normal judgment and thought content. ?CARDIOVASCULAR: Normal heart rate noted ?RESPIRATORY: Effort normal, no problems with respiration noted ?ABDOMEN: Soft, no distention noted.   ?PELVIC: +5 vaginal vault prolapse with vagina completely out of introitus, reduced easily and pessary placed, patient tolerated very well. ?MUSCULOSKELETAL: Normal range of motion. No edema noted. ? ?Exam done with chaperone present. ? ?Labs and Imaging ?No results found. ? 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 ?2. Fitting and adjustment of pessary ?- Reviewed risks/benefits of pessary including risk of vaginal wall erosion, bleeding, discomfort, poor fit, falling out, increased urinary incontinence, benefits including appropriate reduction of prolapse. Reviewed that pessary placed is one size smaller than ordered (ordered size on back order with no idea when it will come in), that pessary may have fit  issues due to this. She would like to proceed with placement. ?- prolapse reduced and #3 ring pessary with support placed easily, patient tolerated very well ?- reviewed instructions, to call if she is having any issues, bleeding, pain, repeat prolapse or if pessary falls out ?- reviewed need for follow up 1 month to check fit and Q 3 months for maintenance ? ? ?Routine preventative health maintenance measures  emphasized. ?Please refer to After Visit Summary for other counseling recommendations.  ? ?Return in about 4 weeks (around 09/30/2021) for pessary check. ? ?K. Therese Sarah, MD, FACOG ?Attending ?[facility]) ? ? ? ?

## 2021-09-05 NOTE — Progress Notes (Signed)
?  HPI with pertinent ROS:  ? ?CC: 3 month follow up ? ?HPI: ?Pleasant 81 year old female presenting today for the following: ? ?A fib- feeling jittery inside today. Taking Coreg as prescribed. No dizziness, SOB, chest pain, headaches, vision changes, or worsening lower extremity edema ? ?HTN- taking Coreg 6.25mg  tablets (1.5 tabs in the morning, 1.5 tabs in the afternoon). Lower extremity edema doing ok. Not wearing compression socks but plans to put them on when she gets home ? ?Hypothyroidism- doing well overall except for feeling jittery inside. No heat/cold intolerance, weight fluctuations, hair/skin/nail changes, or unusual fatigue.  ? ?Mood-notes there has been some significant stress at home causing anxiety/depression symptoms.  Most of this revolves around other family members and their current health/life problems.  She is not currently taking anything for mood management but is interested in options.  Denies SI/HI. ? ?I reviewed the past medical history, family history, social history, surgical history, and allergies today and no changes were needed.  Please see the problem list section below in epic for further details. ? ? ?Physical exam:  ? ?General: Well Developed, well nourished, and in no acute distress.  ?Neuro: Alert and oriented x3.  ?HEENT: Normocephalic, atraumatic.  ?Skin: Warm and dry. ?Cardiac: Irregular rhythm, normal rate, no murmurs rubs or gallops, + mild lower extremity edema.  ?Respiratory: Clear to auscultation bilaterally. Not using accessory muscles, speaking in full sentences. ? ?Impression and Recommendations:   ? ?1. Atrial fibrillation, unspecified type (HCC) ?2. Essential hypertension ?Remains in A-fib although this is stable with no acute changes on EKG.  EKG rate of 74, normal axis.  Discussed various options for treating her hypertension and A-fib.  Reviewed option to increase carvedilol to 12.5 mg twice daily.  She did not respond to this well previously due to significant  GI issues with diarrhea.  Also reviewed the option to switch medications completely.  She would like to try something new so sending in carvedilol CD1 20 mg daily.  Advised to monitor blood pressure and heart rate at home with a goal of 130/80 or less and heart rate greater than 60.  Checking labs today. ?- COMPLETE METABOLIC PANEL WITH GFR ?- CBC with Differential/Platelet ?- EKG 12-Lead ? ?3. Acquired hypothyroidism ?TSH was elevated and we were unable to add free T4 to the blood in the lab at her last visit.  Rechecking TSH and free T4 today. ?- TSH ?- T4, free ? ?4. Anxiety with depression ?Discussed various options.  She does have some significant stress at home with family issues.  She is agreeable to trying a medication so starting sertraline 25 mg daily. ? ?Return in about 2 weeks (around 09/20/2021) for nurse visit for BP check. ?___________________________________________ ?Thayer Ohm, DNP, APRN, FNP-BC ?Primary Care and Sports Medicine ?[facility] ?

## 2021-09-06 ENCOUNTER — Ambulatory Visit (INDEPENDENT_AMBULATORY_CARE_PROVIDER_SITE_OTHER): Payer: Medicare Other | Admitting: Medical-Surgical

## 2021-09-06 ENCOUNTER — Encounter: Payer: Self-pay | Admitting: Medical-Surgical

## 2021-09-06 ENCOUNTER — Encounter: Payer: Self-pay | Admitting: Orthopedic Surgery

## 2021-09-06 VITALS — BP 150/99 | HR 84 | Resp 20 | Ht 62.0 in | Wt 192.1 lb

## 2021-09-06 DIAGNOSIS — I10 Essential (primary) hypertension: Secondary | ICD-10-CM

## 2021-09-06 DIAGNOSIS — E039 Hypothyroidism, unspecified: Secondary | ICD-10-CM | POA: Diagnosis not present

## 2021-09-06 DIAGNOSIS — F418 Other specified anxiety disorders: Secondary | ICD-10-CM

## 2021-09-06 DIAGNOSIS — I4891 Unspecified atrial fibrillation: Secondary | ICD-10-CM | POA: Diagnosis not present

## 2021-09-06 MED ORDER — SERTRALINE HCL 25 MG PO TABS: 25.0000 mg | ORAL_TABLET | Freq: Every day | ORAL | 3 refills | Status: DC

## 2021-09-06 MED ORDER — DILTIAZEM HCL ER COATED BEADS 120 MG PO CP24: 120.0000 mg | ORAL_CAPSULE | Freq: Every day | ORAL | 1 refills | Status: DC

## 2021-09-06 NOTE — Telephone Encounter (Signed)
Okay for occupational therapy 2 times a week for 4 more weeks for shoulder range of motion and strengthening and edema reduction thanks

## 2021-09-07 ENCOUNTER — Other Ambulatory Visit: Payer: Self-pay

## 2021-09-07 DIAGNOSIS — S42292A Other displaced fracture of upper end of left humerus, initial encounter for closed fracture: Secondary | ICD-10-CM

## 2021-09-07 LAB — CBC WITH DIFFERENTIAL/PLATELET
Absolute Monocytes: 676 cells/uL (ref 200–950)
Basophils Absolute: 48 cells/uL (ref 0–200)
Basophils Relative: 0.7 %
Eosinophils Absolute: 117 cells/uL (ref 15–500)
Eosinophils Relative: 1.7 %
HCT: 42.6 % (ref 35.0–45.0)
Hemoglobin: 14.2 g/dL (ref 11.7–15.5)
Lymphs Abs: 704 cells/uL — ABNORMAL LOW (ref 850–3900)
MCH: 30.4 pg (ref 27.0–33.0)
MCHC: 33.3 g/dL (ref 32.0–36.0)
MCV: 91.2 fL (ref 80.0–100.0)
MPV: 10.5 fL (ref 7.5–12.5)
Monocytes Relative: 9.8 %
Neutro Abs: 5354 cells/uL (ref 1500–7800)
Neutrophils Relative %: 77.6 %
Platelets: 245 10*3/uL (ref 140–400)
RBC: 4.67 10*6/uL (ref 3.80–5.10)
RDW: 13.8 % (ref 11.0–15.0)
Total Lymphocyte: 10.2 %
WBC: 6.9 10*3/uL (ref 3.8–10.8)

## 2021-09-07 LAB — COMPLETE METABOLIC PANEL WITH GFR
AG Ratio: 1.3 (calc) (ref 1.0–2.5)
ALT: 16 U/L (ref 6–29)
AST: 14 U/L (ref 10–35)
Albumin: 3.8 g/dL (ref 3.6–5.1)
Alkaline phosphatase (APISO): 120 U/L (ref 37–153)
BUN/Creatinine Ratio: 21 (calc) (ref 6–22)
BUN: 22 mg/dL (ref 7–25)
CO2: 27 mmol/L (ref 20–32)
Calcium: 9.2 mg/dL (ref 8.6–10.4)
Chloride: 102 mmol/L (ref 98–110)
Creat: 1.05 mg/dL — ABNORMAL HIGH (ref 0.60–0.95)
Globulin: 2.9 g/dL (calc) (ref 1.9–3.7)
Glucose, Bld: 101 mg/dL — ABNORMAL HIGH (ref 65–99)
Potassium: 4 mmol/L (ref 3.5–5.3)
Sodium: 138 mmol/L (ref 135–146)
Total Bilirubin: 0.6 mg/dL (ref 0.2–1.2)
Total Protein: 6.7 g/dL (ref 6.1–8.1)
eGFR: 54 mL/min/{1.73_m2} — ABNORMAL LOW (ref >=60)

## 2021-09-07 LAB — T4, FREE: Free T4: 1.2 ng/dL (ref 0.8–1.8)

## 2021-09-07 LAB — TSH: TSH: 5.34 mIU/L — ABNORMAL HIGH (ref 0.40–4.50)

## 2021-09-08 ENCOUNTER — Encounter: Payer: Self-pay | Admitting: Orthopedic Surgery

## 2021-09-21 ENCOUNTER — Ambulatory Visit (INDEPENDENT_AMBULATORY_CARE_PROVIDER_SITE_OTHER): Payer: Medicare Other | Admitting: Medical-Surgical

## 2021-09-21 VITALS — BP 146/97 | HR 88

## 2021-09-21 DIAGNOSIS — I10 Essential (primary) hypertension: Secondary | ICD-10-CM

## 2021-09-21 MED ORDER — DILTIAZEM HCL ER COATED BEADS 180 MG PO CP24: 180.0000 mg | ORAL_CAPSULE | Freq: Every day | ORAL | 1 refills | Status: DC

## 2021-09-21 NOTE — Progress Notes (Signed)
? ?  Established Patient Office Visit ? ?Subjective   ?Patient ID: Courtney Stevenson, female    DOB: 01-24-1941  Age: 81 y.o. MRN: [id number] ? ?Chief Complaint  ?Patient presents with  ? Hypertension  ? ? ?HPI ? ?Courtney Stevenson is here for blood pressure check. Her home readings have been around 140/88. Denies chest pain, shortness of breath or dizziness.  ? ?ROS ? ?  ?Objective:  ?  ? ?BP (!) 146/97   Pulse 88   SpO2 97%  ? ? ?Physical Exam ? ? ?No results found for any visits on 09/21/21. ? ? ? ?The ASCVD Risk score (Arnett DK, et al., 2019) failed to calculate for the following reasons: ?  The 2019 ASCVD risk score is only valid for ages 32 to 48 ? ?  ?Assessment & Plan:  ?Hypertension - Patient advised to take Diltiazem 180 mg once daily and follow up a 2 week nurse visit for blood pressure check.  ? ?Problem List Items Addressed This Visit   ? ? Essential hypertension - Primary  ? ? ?Return in about 2 weeks (around 10/05/2021) for blood pressure check nurse visit. .  ? ? ?Lavell Luster, [place] ? ?

## 2021-09-21 NOTE — Progress Notes (Signed)
Agree with documentation as above. Diltiazem 180mg  sent to pharmacy on file. ? ?___________________________________________ ? , DNP, APRN, FNP-BC ?Primary Care and Sports Medicine ?[facility] ? ?

## 2021-09-24 NOTE — Telephone Encounter (Signed)
Patients daughter is requesting her mother be put in PT she would like someone to call her and let her know what all is needed to complete this ?

## 2021-09-27 NOTE — Telephone Encounter (Signed)
Next available gel injection has to be after 10/07/2021.   ?Talked with patient and appointment has been scheduled for 10/08/2021 with Franky Macho. ?

## 2021-09-29 ENCOUNTER — Other Ambulatory Visit: Payer: Self-pay | Admitting: Medical-Surgical

## 2021-09-30 ENCOUNTER — Encounter: Payer: Self-pay | Admitting: Orthopedic Surgery

## 2021-09-30 ENCOUNTER — Ambulatory Visit: Payer: Medicare Other | Admitting: Obstetrics and Gynecology

## 2021-09-30 ENCOUNTER — Encounter: Payer: Self-pay | Admitting: Obstetrics and Gynecology

## 2021-09-30 VITALS — BP 180/91 | HR 88 | Ht 62.0 in | Wt 193.0 lb

## 2021-09-30 DIAGNOSIS — Z4689 Encounter for fitting and adjustment of other specified devices: Secondary | ICD-10-CM | POA: Diagnosis not present

## 2021-09-30 DIAGNOSIS — N819 Female genital prolapse, unspecified: Secondary | ICD-10-CM

## 2021-09-30 NOTE — Progress Notes (Signed)
? ?GYNECOLOGY OFFICE NOTE ? ?History:  ?81 y.o. G1P1001 here today for pessary insertion. Previously placed #3 ring pessary due to complete procidentia and it fell out after about a day. No acute concerns. ? ?Past Medical History:  ?Diagnosis Date  ? A-fib (HCC)   ? Chronic kidney disease   ? Hypertension   ? ? ?Past Surgical History:  ?Procedure Laterality Date  ? ABDOMINAL HYSTERECTOMY    ? JOINT REPLACEMENT    ? TONSILLECTOMY    ? ? ? ?Current Outpatient Medications:  ?  acetaminophen (TYLENOL) 325 MG tablet, Take 650 mg by mouth every 6 (six) hours as needed., Disp: , Rfl:  ?  albuterol (VENTOLIN HFA) 108 (90 Base) MCG/ACT inhaler, Inhale 2 puffs into the lungs every 6 (six) hours as needed for wheezing., Disp: 2 each, Rfl: 11 ?  [employer] ASPIRIN LOW DOSE 81 MG EC tablet, TAKE 1 TABLET BY MOUTH EVERY DAY, Disp: 90 tablet, Rfl: 3 ?  diltiazem (CARDIZEM CD) 180 MG 24 hr capsule, Take 1 capsule (180 mg total) by mouth daily., Disp: 30 capsule, Rfl: 1 ?  furosemide (LASIX) 40 MG tablet, TAKE 1 TABLET BY MOUTH EVERY DAY, Disp: 90 tablet, Rfl: 1 ?  Lidocaine (HM LIDOCAINE PATCH) 4 % PTCH, Apply to affected area 12 hrs on and 12 hrs off, Disp: 30 patch, Rfl: 0 ?  sertraline (ZOLOFT) 25 MG tablet, Take 1 tablet (25 mg total) by mouth daily., Disp: 30 tablet, Rfl: 3 ? ?The following portions of the patient's history were reviewed and updated as appropriate: allergies, current medications, past family history, past medical history, past social history, past surgical history and problem list.  ? ?Review of Systems:  ?Pertinent items noted in HPI and remainder of comprehensive ROS otherwise negative. ?  ?Objective:  ?Physical Exam ?BP (!) 180/91   Pulse 88   Ht 5\' 2"  (1.575 m)   Wt 193 lb (87.5 kg)   BMI 35.30 kg/m?  ?CONSTITUTIONAL: Well-developed, well-nourished female in no acute distress.  ?HENT:  Normocephalic, atraumatic. External right and left ear normal. Oropharynx is clear and moist ?EYES: Conjunctivae and EOM are  normal. Pupils are equal, round, and reactive to light. No scleral icterus.  ?NECK: Normal range of motion, supple, no masses ?SKIN: Skin is warm and dry. No rash noted. Not diaphoretic. No erythema. No pallor. ?NEUROLOGIC: Alert and oriented to person, place, and time. Normal reflexes, muscle tone coordination. No cranial nerve deficit noted. ?PSYCHIATRIC: Normal mood and affect. Normal behavior. Normal judgment and thought content. ?CARDIOVASCULAR: Normal heart rate noted ?RESPIRATORY: Effort normal, no problems with respiration noted ?ABDOMEN: Soft, no distention noted.   ?PELVIC: +5 vaginal vault prolapse, easily reducible, pessary placed easily, patient tolerated well ?MUSCULOSKELETAL: Normal range of motion. No edema noted. ? ?Exam done with chaperone present. ? ?Labs and Imaging ?No results found. ? ?Assessment & Plan:  ?1. Vaginal vault prolapse ?Complete procidential ? ?2. Fitting and adjustment of pessary ?- Again reviewed risks/benefits of pessary including risk of vaginal wall erosion, bleeding, discomfort, poor fit, falling out, increased urinary incontinence, benefits including appropriate reduction of prolapse. Reviewed that pessary placed is one size smaller than ordered, although larger than last pessary (ordered size on back order), that pessary may have fit issues due to this. She would like to proceed with placement. ?- prolapse reduced and ring pessary with support placed easily, patient tolerated very well ?- reviewed instructions, to call if she is having any issues, bleeding, pain, repeat prolapse or if  pessary falls out ?- reviewed need for follow up 2 weeks to check fit and Q 3 months for maintenance ? ? ?Routine preventative health maintenance measures emphasized. ?Please refer to After Visit Summary for other counseling recommendations.  ? ?Return in about 2 weeks (around 10/14/2021). ? ?K. Therese Sarah, MD, FACOG ?Attending ?[facility]) ? ? ? ?

## 2021-10-01 IMAGING — DX DG CHEST 2V
2 series · 2 of 2 positions shown · non-contrast
Comparison: 05/28/2020 chest radiograph.

CLINICAL DATA: Dyspnea, crackles at the lung bases, recent COVID in
[REDACTED], persistent productive cough

EXAM:
CHEST - 2 VIEW

[chest pa]
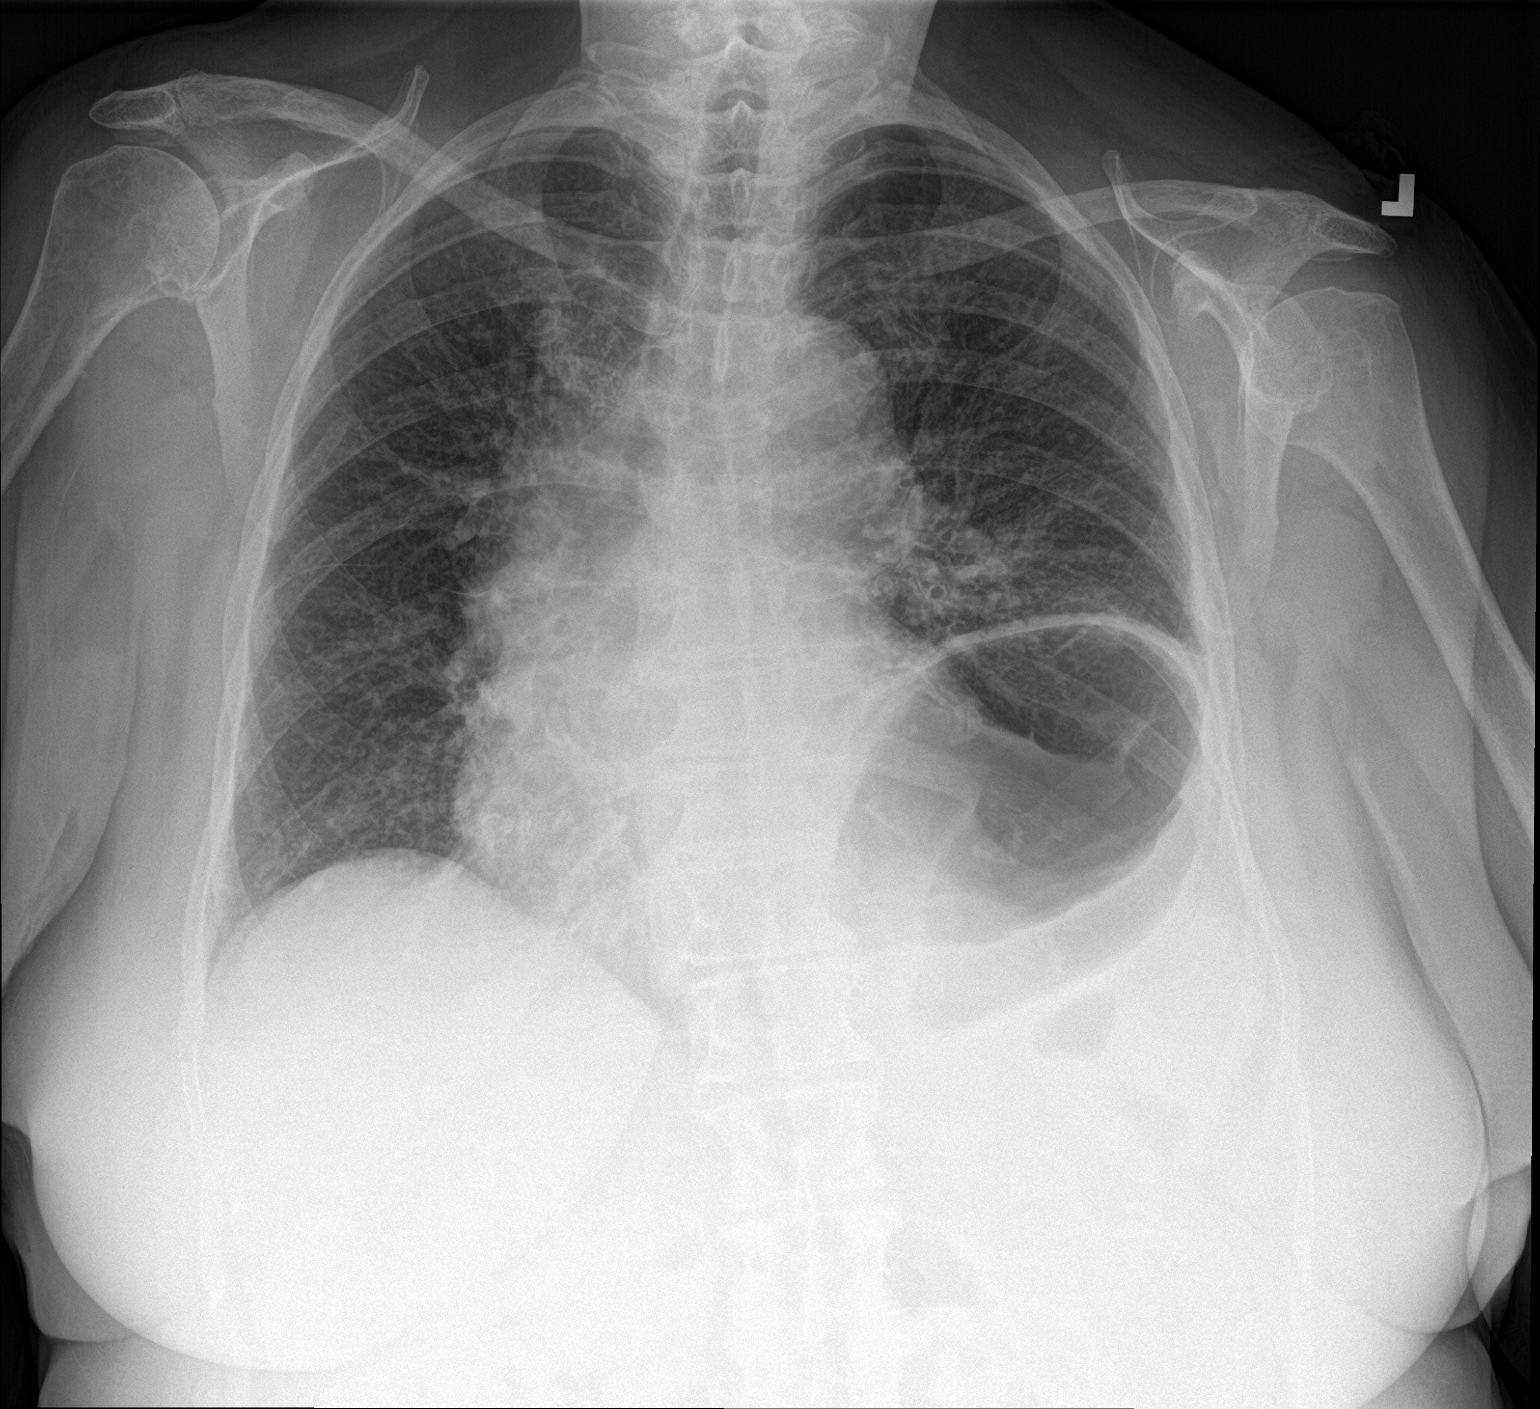

[chest lat]
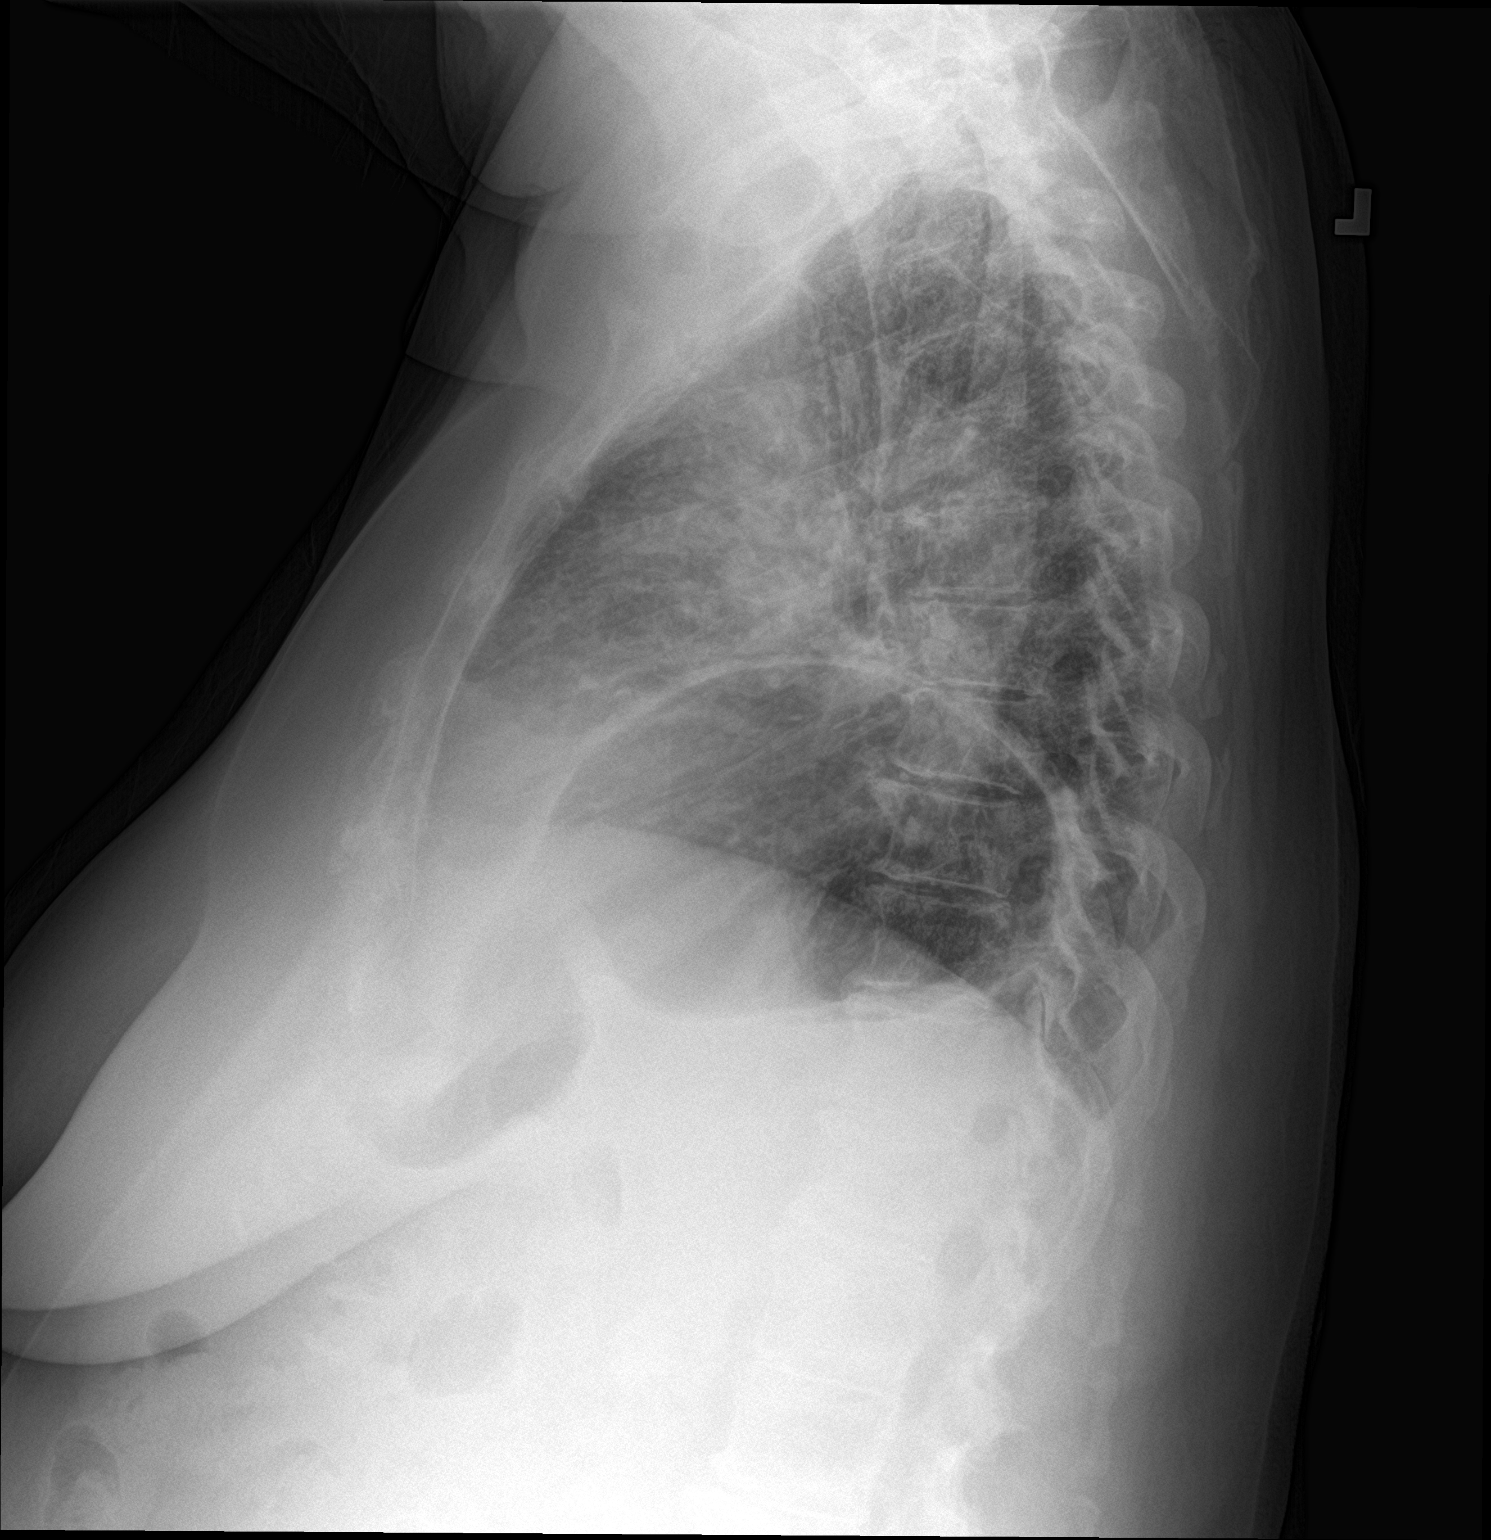

[2 of 2 positions shown; findings below may reference images not displayed]

FINDINGS: Stable cardiomediastinal silhouette with normal heart size. No
pneumothorax. No pleural effusion. Stable prominent elevation of the
left hemidiaphragm. No acute consolidative airspace disease. Faint
patchy hazy and reticular opacities in both lungs, similar.
IMPRESSION: Persistent patchy faint hazy and reticular opacities in both lungs,
not appreciably changed since 05/28/2020 radiograph, although
improved from 05/20/2020 radiograph. If there is clinical concern
for postinflammatory fibrosis, a follow-up high-resolution chest CT
study would be indicated in 3-6 months.

## 2021-10-05 ENCOUNTER — Telehealth: Payer: Self-pay | Admitting: Orthopedic Surgery

## 2021-10-05 ENCOUNTER — Encounter: Payer: Self-pay | Admitting: Medical-Surgical

## 2021-10-05 DIAGNOSIS — Z961 Presence of intraocular lens: Secondary | ICD-10-CM | POA: Insufficient documentation

## 2021-10-05 NOTE — Telephone Encounter (Signed)
IC verbal given.  

## 2021-10-05 NOTE — Telephone Encounter (Signed)
Courtney Stevenson is calling from [facility] to receive verbal orders for physical therapy to go to her home for PT. Please advise ?

## 2021-10-06 ENCOUNTER — Encounter: Payer: Self-pay | Admitting: Medical-Surgical

## 2021-10-06 ENCOUNTER — Other Ambulatory Visit: Payer: Self-pay | Admitting: Medical-Surgical

## 2021-10-06 ENCOUNTER — Telehealth (INDEPENDENT_AMBULATORY_CARE_PROVIDER_SITE_OTHER): Payer: Medicare Other | Admitting: Medical-Surgical

## 2021-10-06 ENCOUNTER — Other Ambulatory Visit: Payer: Self-pay

## 2021-10-06 ENCOUNTER — Ambulatory Visit: Payer: Medicare Other

## 2021-10-06 DIAGNOSIS — M1711 Unilateral primary osteoarthritis, right knee: Secondary | ICD-10-CM

## 2021-10-06 DIAGNOSIS — R600 Localized edema: Secondary | ICD-10-CM | POA: Diagnosis not present

## 2021-10-06 NOTE — Telephone Encounter (Signed)
Pt given appt for 11:30 AM today.  Courtney Stevenson, CMA ?

## 2021-10-06 NOTE — Progress Notes (Signed)
Virtual Visit via Telephone ?  ?I connected with  Courtney Stevenson  on 10/06/21 by telephone/telehealth and verified that I am speaking with the correct person using two identifiers. ?  ?I discussed the limitations, risks, security and privacy concerns of performing an evaluation and management service by telephone, including the higher likelihood of inaccurate diagnosis and treatment, and the availability of in person appointments.  We also discussed the likely need of an additional face to face encounter for complete and high quality delivery of care.  I also discussed with the patient that there may be a patient responsible charge related to this service. The patient expressed understanding and wishes to proceed. ? ?Provider location is in medical facility. ?Patient location is at their home, different from provider location. ?People involved in care of the patient during this telehealth encounter were myself, my nurse/medical assistant, and my front office/scheduling team member. ? ?CC: lower extremity swelling ? ?HPI: ?Very pleasant 81 year old female presenting via telephone visit to discuss continued lower extremity edema.  She had been doing very well and had the edema under control but unfortunately, over the last couple of days her swelling has worsened.  Her left leg was very swollen and weeping yesterday but has improved today after she was able to get her compression socks on.  Her right leg continues to be very swollen and is weeping today.  She does have compression socks but there is some trouble getting them on since her husband usually helps her and he is having some acute back pain.  She wonders if the swelling is related to diltiazem that she started taking not long ago.  Has been checking her blood pressures at home with readings in the 140-150/80s range.  She has seen a few that were a little lower than.  She is taking Lasix 40 mg daily as prescribed, tolerating well without side effects.  Denies  fever, chills, shortness of breath, chest pain, and leg pain/redness. ? ? ?Review of Systems: See HPI for pertinent positives and negatives.  ? ?Objective Findings:   ? ?General: Speaking full sentences, no audible heavy breathing.  Sounds alert and appropriately interactive.   ? ?Impression and Recommendations:   ? ?1. Bilateral lower extremity edema ?Unfortunately, this is likely going to be a chronic condition where she is continuingly fighting the fluid collection in her legs.  While the diltiazem may be playing a role in it I believe this flare of her edema is multifactorial.  She had an eye surgery procedure 2 days ago that required anesthesia.  She is also mostly sedentary and has had trouble with wearing her compression socks.  She does follow a low-sodium diet so that is of benefit.  At this point, I think continuing the diltiazem as prescribed is the best bet.  She has had multiple reactions to previous medications so would like to avoid switching from a medication that seems to be well-tolerated.  For now, increase Lasix to 40 mg in the morning and 20 mg in the afternoon around 3-4 PM.  We will touch base again on Friday to see if she has seen improvement in this and likely plan for an in office visit next week to evaluate the progress and recheck her kidney function.  Try to wear compression socks as much as possible and keep legs elevated above heart level when seated. ? ?I discussed the above assessment and treatment plan with the patient. The patient was provided an opportunity to ask questions and  all were answered. The patient agreed with the plan and demonstrated an understanding of the instructions. ?  ?The patient was advised to call back or seek an in-person evaluation if the symptoms worsen or if the condition fails to improve as anticipated. ? ?20 minutes of non-face-to-face time was provided during this encounter. ? ?Return in about 2 days (around 10/08/2021) for Telephone visit to check up on  lower extremity edema. ?___________________________________________ ?Christen Butter, DNP, APRN, FNP-BC ?Primary Care and Sports Medicine ?[facility] ? ?

## 2021-10-06 NOTE — Telephone Encounter (Signed)
LVM requesting that pt return call to schedule appt.  Tiajuana Amass, CMA ?

## 2021-10-08 ENCOUNTER — Telehealth (INDEPENDENT_AMBULATORY_CARE_PROVIDER_SITE_OTHER): Payer: Medicare Other | Admitting: Medical-Surgical

## 2021-10-08 ENCOUNTER — Encounter: Payer: Self-pay | Admitting: Medical-Surgical

## 2021-10-08 ENCOUNTER — Ambulatory Visit: Payer: Medicare Other | Admitting: Surgical

## 2021-10-08 DIAGNOSIS — M1711 Unilateral primary osteoarthritis, right knee: Secondary | ICD-10-CM | POA: Diagnosis not present

## 2021-10-08 DIAGNOSIS — R600 Localized edema: Secondary | ICD-10-CM | POA: Diagnosis not present

## 2021-10-08 NOTE — Progress Notes (Signed)
Virtual Visit via Telephone   I connected with  Courtney Stevenson  on 10/08/21 by telephone/telehealth and verified that I am speaking with the correct person using two identifiers.   I discussed the limitations, risks, security and privacy concerns of performing an evaluation and management service by telephone, including the higher likelihood of inaccurate diagnosis and treatment, and the availability of in person appointments.  We also discussed the likely need of an additional face to face encounter for complete and high quality delivery of care.  I also discussed with the patient that there may be a patient responsible charge related to this service. The patient expressed understanding and wishes to proceed.  Provider location is in medical facility. Patient location is at their home, different from provider location. People involved in care of the patient during this telehealth encounter were myself, my nurse/medical assistant, and my front office/scheduling team member.  CC: lower extremity edema  HPI: Courtney Stevenson 81 year old presenting via telephone for follow up on lower extremity edema. She has been taking Lasix 40mg  in the morning with a 20mg  dose in the afternoon for the last 2 days. Has noted an improvement in her swelling and the weeping has almost stopped. Having difficulty getting her compression socks on so has been using ACE wraps instead.   Review of Systems: See HPI for pertinent positives and negatives.   Objective Findings:    General: Speaking full sentences, no audible heavy breathing.  Sounds alert and appropriately interactive.    Impression and Recommendations:    1. Bilateral lower extremity edema Continue Lasix 40mg  in the morning and 20mg  in the afternoon. Continue compression, elevation, and sodium restriction. Plan to have kidney function/electrolytes checked in 2 weeks, lab order in.  - BASIC METABOLIC PANEL WITH GFR  I discussed the above assessment and treatment  plan with the patient. The patient was provided an opportunity to ask questions and all were answered. The patient agreed with the plan and demonstrated an understanding of the instructions.   The patient was advised to call back or seek an in-person evaluation if the symptoms worsen or if the condition fails to improve as anticipated.  13 minutes of non-face-to-face time was provided during this encounter.  Return in about 2 weeks (around 10/22/2021) for labs for edema follow up (order already in). ___________________________________________ , DNP, APRN, FNP-BC Primary Care and Sports Medicine [facility] [place]

## 2021-10-13 ENCOUNTER — Encounter: Payer: Self-pay | Admitting: Surgical

## 2021-10-13 ENCOUNTER — Ambulatory Visit: Payer: Medicare Other

## 2021-10-13 ENCOUNTER — Telehealth: Payer: Self-pay | Admitting: Surgical

## 2021-10-13 MED ORDER — BUPIVACAINE HCL 0.25 % IJ SOLN
4.0000 mL | INTRAMUSCULAR | Status: AC | PRN
  Administered 2021-10-08: 4 mL via INTRA_ARTICULAR

## 2021-10-13 MED ORDER — LIDOCAINE HCL 1 % IJ SOLN
5.0000 mL | INTRAMUSCULAR | Status: AC | PRN
  Administered 2021-10-08: 5 mL

## 2021-10-13 MED ORDER — METHYLPREDNISOLONE ACETATE 40 MG/ML IJ SUSP
40.0000 mg | INTRAMUSCULAR | Status: AC | PRN
  Administered 2021-10-08: 40 mg via INTRA_ARTICULAR

## 2021-10-13 MED ORDER — SODIUM HYALURONATE 60 MG/3ML IX PRSY
60.0000 mg | PREFILLED_SYRINGE | INTRA_ARTICULAR | Status: AC | PRN
  Administered 2021-10-08: 60 mg via INTRA_ARTICULAR

## 2021-10-13 NOTE — Telephone Encounter (Signed)
Is this ok?

## 2021-10-13 NOTE — Progress Notes (Signed)
   Procedure Note  Patient: Courtney Stevenson             Date of Birth: [birth year]           MRN: [id number]             Visit Date: 10/08/2021  Procedures: Visit Diagnoses:  1. Arthritis of right knee     Large Joint Inj: R knee on 10/08/2021 12:10 PM Indications: diagnostic evaluation, joint swelling and pain Details: 18 G 1.5 in needle, superolateral approach  Arthrogram: No  Medications: 5 mL lidocaine 1 %; 40 mg methylPREDNISolone acetate 40 MG/ML; 4 mL bupivacaine 0.25 %; 60 mg Sodium Hyaluronate 60 MG/3ML Outcome: tolerated well, no immediate complications Procedure, treatment alternatives, risks and benefits explained, specific risks discussed. Consent was given by the patient. Immediately prior to procedure a time out was called to verify the correct patient, procedure, equipment, support staff and site/side marked as required. Patient was prepped and draped in the usual sterile fashion.

## 2021-10-13 NOTE — Telephone Encounter (Signed)
I am down for that

## 2021-10-13 NOTE — Telephone Encounter (Signed)
Courtney Stevenson (PT) called from [facility] called for home health pt verbal orders. Courtney Stevenson is requesting 1 wk 4. Please call her on her secure line at [phone]. If unable to answer leave detailed vm.

## 2021-10-13 NOTE — Telephone Encounter (Signed)
Called and gave verbal ok 

## 2021-10-21 ENCOUNTER — Telehealth: Payer: Self-pay | Admitting: Orthopedic Surgery

## 2021-10-21 ENCOUNTER — Encounter: Payer: Self-pay | Admitting: Obstetrics and Gynecology

## 2021-10-21 ENCOUNTER — Ambulatory Visit: Payer: Medicare Other | Admitting: Obstetrics and Gynecology

## 2021-10-21 DIAGNOSIS — Z4689 Encounter for fitting and adjustment of other specified devices: Secondary | ICD-10-CM | POA: Diagnosis not present

## 2021-10-21 DIAGNOSIS — N819 Female genital prolapse, unspecified: Secondary | ICD-10-CM | POA: Diagnosis not present

## 2021-10-21 NOTE — Telephone Encounter (Signed)
IC verbal given.  

## 2021-10-21 NOTE — Progress Notes (Signed)
   GYNECOLOGY OFFICE VISIT NOTE  History:   Courtney Stevenson is a 81 y.o. G1P1001 here today for pessary check. She has had no issues and felt the pessary was still in (however patient was asymptomatic with her prolapse before as well).   She denies any abnormal vaginal discharge, bleeding, pelvic pain or other concerns.     Past Medical History:  Diagnosis Date   A-fib (HCC)    Chronic kidney disease    Hypertension     Past Surgical History:  Procedure Laterality Date   ABDOMINAL HYSTERECTOMY     JOINT REPLACEMENT     TONSILLECTOMY      The following portions of the patient's history were reviewed and updated as appropriate: allergies, current medications, past family history, past medical history, past social history, past surgical history and problem list.    Review of Systems:  Pertinent items noted in HPI and remainder of comprehensive ROS otherwise negative.  Physical Exam:  There were no vitals taken for this visit. CONSTITUTIONAL: Well-developed, well-nourished female in no acute distress.  HEENT:  Normocephalic, atraumatic. External right and left ear normal. No scleral icterus.  NECK: Normal range of motion, supple, no masses noted on observation SKIN: No rash noted. Not diaphoretic. No erythema. No pallor. MUSCULOSKELETAL: Normal range of motion. No edema noted. NEUROLOGIC: Alert and oriented to person, place, and time. Normal muscle tone coordination. No cranial nerve deficit noted. PSYCHIATRIC: Normal mood and affect. Normal behavior. Normal judgment and thought content.  CARDIOVASCULAR: Normal heart rate noted RESPIRATORY: Effort and breath sounds normal, no problems with respiration noted ABDOMEN: No masses noted. No other overt distention noted.    PELVIC:  complete prolapse found - no pessary present   Labs and Imaging No results found for this or any previous visit (from the past 168 hour(s)). No results found.  Assessment and Plan:  Diagnoses and all  orders for this visit:  Vaginal vault prolapse Complete  Fitting and adjustment of pessary - Again reviewed risks/benefits of pessary including risk of vaginal wall erosion, bleeding, discomfort, poor fit, falling out, increased urinary incontinence, benefits including appropriate reduction of prolapse. Placed pessary that was originally ordered and was on back order (3 in ring with support) - prolapse reduced and ring pessary with support placed easily, patient tolerated very well - reviewed instructions, to call if she is having any issues, bleeding, pain, repeat prolapse or if pessary falls out - reviewed need for follow up 2 weeks to check fit and Q 3-6 months for maintenance -- if this pessary falls out, recommend referral to Urogyn. Reviewed may have other pessary options but may also consider the option of colpocleisis. I want her to come back after 2 weeks since last pessary fell out and she did not know.   Routine preventative health maintenance measures emphasized. Please refer to After Visit Summary for other counseling recommendations.   Return in about 2 weeks (around 11/04/2021) for Pessary check.  Milas Hock, MD, FACOG Obstetrician & Gynecologist, [facility] for [facility], [facility]

## 2021-10-21 NOTE — Telephone Encounter (Signed)
Home health called requesting orders to start OT next week?   [phone]

## 2021-11-01 ENCOUNTER — Other Ambulatory Visit: Payer: Self-pay | Admitting: *Deleted

## 2021-11-01 DIAGNOSIS — N819 Female genital prolapse, unspecified: Secondary | ICD-10-CM

## 2021-11-01 NOTE — Progress Notes (Signed)
Pessary has falling out again.  Per Dr Josefine Class office note a referral was placed for Uro/gyn.  Pt to wash the pessary and put in a baggie.  I told her to take it with her to the uro/gyn just in case she wants to put it back in.

## 2021-11-03 ENCOUNTER — Telehealth: Payer: Self-pay

## 2021-11-03 ENCOUNTER — Other Ambulatory Visit: Payer: Self-pay | Admitting: Medical-Surgical

## 2021-11-03 NOTE — Telephone Encounter (Signed)
[place] Physical Therapist from [facility] called to let you know that the patient has a bruise on her Right lower bicep on the internal portion.  She was turning off the water and heard a pop. Since then she's had decreased range of motion in the shoulder and having issue holding things with weight to them including coffee.  The patient has an appointment with you next week.  Dineka [phone]

## 2021-11-04 NOTE — Telephone Encounter (Signed)
Dr. Karie Schwalbe is booked solid all next week... not sure what I should do appointment wise?

## 2021-11-05 NOTE — Telephone Encounter (Signed)
Spoke with patient and she stated that her arm is feeling better. She just has a bruise right above her elbow on the right arm. Joy recommended if she starts have pain in her arm to go to an Ortho Urgent Care since she is out of the office next week and Dr. Karie Schwalbe is booked. She stated that she has Occupational Therapy coming to the house next week and she will have them look at it and if she needs to she can always go to the Orthopedic doctor that she has seen previously for her shoulder injury.

## 2021-11-07 ENCOUNTER — Other Ambulatory Visit: Payer: Self-pay | Admitting: Medical-Surgical

## 2021-11-09 ENCOUNTER — Ambulatory Visit: Payer: Medicare Other

## 2021-11-10 ENCOUNTER — Ambulatory Visit: Payer: Medicare Other

## 2021-11-11 ENCOUNTER — Ambulatory Visit: Payer: Medicare Other | Admitting: Obstetrics and Gynecology

## 2021-11-11 ENCOUNTER — Other Ambulatory Visit: Payer: Self-pay | Admitting: Medical-Surgical

## 2021-11-12 ENCOUNTER — Other Ambulatory Visit: Payer: Self-pay

## 2021-11-12 ENCOUNTER — Ambulatory Visit (INDEPENDENT_AMBULATORY_CARE_PROVIDER_SITE_OTHER): Payer: Medicare Other | Admitting: Family Medicine

## 2021-11-12 VITALS — BP 159/95 | HR 79 | Ht 62.0 in | Wt 206.0 lb

## 2021-11-12 DIAGNOSIS — I10 Essential (primary) hypertension: Secondary | ICD-10-CM

## 2021-11-12 DIAGNOSIS — S61211A Laceration without foreign body of left index finger without damage to nail, initial encounter: Secondary | ICD-10-CM | POA: Diagnosis not present

## 2021-11-12 MED ORDER — DILTIAZEM HCL ER COATED BEADS 180 MG PO CP24: 180.0000 mg | ORAL_CAPSULE | Freq: Every day | ORAL | 1 refills | Status: DC

## 2021-11-13 LAB — BASIC METABOLIC PANEL WITH GFR
BUN/Creatinine Ratio: 20 (calc) (ref 6–22)
BUN: 21 mg/dL (ref 7–25)
CO2: 28 mmol/L (ref 20–32)
Calcium: 8.8 mg/dL (ref 8.6–10.4)
Chloride: 101 mmol/L (ref 98–110)
Creat: 1.04 mg/dL — ABNORMAL HIGH (ref 0.60–0.95)
Glucose, Bld: 82 mg/dL (ref 65–99)
Potassium: 4.3 mmol/L (ref 3.5–5.3)
Sodium: 138 mmol/L (ref 135–146)
eGFR: 54 mL/min/{1.73_m2} — ABNORMAL LOW (ref >=60)

## 2021-11-17 ENCOUNTER — Encounter: Payer: Self-pay | Admitting: Medical-Surgical

## 2021-12-05 NOTE — Progress Notes (Signed)
Virtual Visit via Telephone   I connected with  Courtney Stevenson  on 12/06/21 by telephone/telehealth and verified that I am speaking with the correct person using two identifiers.   I discussed the limitations, risks, security and privacy concerns of performing an evaluation and management service by telephone, including the higher likelihood of inaccurate diagnosis and treatment, and the availability of in person appointments.  We also discussed the likely need of an additional face to face encounter for complete and high quality delivery of care.  I also discussed with the patient that there may be a patient responsible charge related to this service. The patient expressed understanding and wishes to proceed.  Provider location is in medical facility. Patient location is at their home, different from provider location. People involved in care of the patient during this telehealth encounter were myself, my nurse/medical assistant, and my front office/scheduling team member.  CC: BP meds not working  HPI: Pleasant 81 year old female presenting by telephone to discuss blood pressure medications. Patient reports that her current medication of Diltiazem 180mg  daily is not working.  She did fine on the lower dose of 120 mg daily but with the increase in her diltiazem dosing to 180 mg daily, she started to experience worsening lower extremity edema.  She has since discontinued the diltiazem and restarted a prescription she had for carvedilol at home.  She is only taking this once a day and is not sure of the dosage but this seems to be working well to control her blood pressure.  Her blood pressure yesterday was 121/68.  Unfortunately, she still has some GI effects with the carvedilol.  Looking for guidance on good blood pressure medication that will also help maintain her A-fib but have very limited side effects.  Does note that her lower extremity edema has gotten much worse and she is to the point of almost  being immobile.  She does have Lasix and is taking 40 mg once daily with 20 mg extra dose in the afternoon on Monday Wednesday and Friday.  She is tolerating the Lasix well without significant side effects.  Review of Systems: See HPI for pertinent positives and negatives.   Objective Findings:    General: Speaking full sentences, no audible heavy breathing.  Sounds alert and appropriately interactive.    Impression and Recommendations:    Essential hypertension Discontinue diltiazem.  Start Coreg CR 10 mg daily.  Monitor blood pressure at home with a goal of 130/80 or less.  Low-sodium diet recommended.  Chronic pain of right knee Per patient report, she is due for a total knee replacement but is not a surgical candidate.  Referring to pain management for further evaluation of a good pain management regimen that is also safe at her age and well-tolerated with a known history of GI side effects with many medications.  Bilateral lower extremity edema Increase Lasix to 60 mg every morning.  Recommend compression socks and elevation of the lower extremities whenever sitting.  I discussed the above assessment and treatment plan with the patient. The patient was provided an opportunity to ask questions and all were answered. The patient agreed with the plan and demonstrated an understanding of the instructions.   The patient was advised to call back or seek an in-person evaluation if the symptoms worsen or if the condition fails to improve as anticipated.  20 minutes of non-face-to-face time was provided during this encounter.  Return in about 2 weeks (around 12/20/2021) for nurse visit for  BP check. ___________________________________________ Christen Butter, DNP, APRN, FNP-BC Primary Care and Sports Medicine [facility]

## 2021-12-06 ENCOUNTER — Encounter: Payer: Self-pay | Admitting: Medical-Surgical

## 2021-12-06 ENCOUNTER — Telehealth (INDEPENDENT_AMBULATORY_CARE_PROVIDER_SITE_OTHER): Payer: Medicare Other | Admitting: Medical-Surgical

## 2021-12-06 DIAGNOSIS — I10 Essential (primary) hypertension: Secondary | ICD-10-CM

## 2021-12-06 DIAGNOSIS — G8929 Other chronic pain: Secondary | ICD-10-CM | POA: Diagnosis not present

## 2021-12-06 DIAGNOSIS — R600 Localized edema: Secondary | ICD-10-CM

## 2021-12-06 DIAGNOSIS — M25561 Pain in right knee: Secondary | ICD-10-CM

## 2021-12-06 MED ORDER — CARVEDILOL PHOSPHATE ER 10 MG PO CP24: 10.0000 mg | ORAL_CAPSULE | Freq: Every day | ORAL | 1 refills | Status: DC

## 2021-12-06 MED ORDER — ALBUTEROL SULFATE HFA 108 (90 BASE) MCG/ACT IN AERS: 2.0000 | INHALATION_SPRAY | Freq: Four times a day (QID) | RESPIRATORY_TRACT | 11 refills | Status: DC | PRN

## 2021-12-06 NOTE — Assessment & Plan Note (Signed)
Discontinue diltiazem.  Start Coreg CR 10 mg daily.  Monitor blood pressure at home with a goal of 130/80 or less.  Low-sodium diet recommended.

## 2021-12-06 NOTE — Assessment & Plan Note (Signed)
Increase Lasix to 60 mg every morning.  Recommend compression socks and elevation of the lower extremities whenever sitting.

## 2021-12-06 NOTE — Assessment & Plan Note (Signed)
Per patient report, she is due for a total knee replacement but is not a surgical candidate.  Referring to pain management for further evaluation of a good pain management regimen that is also safe at her age and well-tolerated with a known history of GI side effects with many medications.

## 2021-12-07 ENCOUNTER — Telehealth: Payer: Self-pay

## 2021-12-07 ENCOUNTER — Other Ambulatory Visit: Payer: Self-pay | Admitting: Medical-Surgical

## 2021-12-07 NOTE — Telephone Encounter (Signed)
Patient called to let you know that the BP medication that was sent yesterday is not covered through her insurance and will be a little over $100 and wants to know if you can send in another BP medication.

## 2021-12-09 ENCOUNTER — Encounter: Payer: Self-pay | Admitting: Medical-Surgical

## 2021-12-14 ENCOUNTER — Encounter: Payer: Self-pay | Admitting: Orthopedic Surgery

## 2021-12-14 ENCOUNTER — Ambulatory Visit: Payer: Medicare Other | Admitting: Orthopedic Surgery

## 2021-12-14 DIAGNOSIS — I890 Lymphedema, not elsewhere classified: Secondary | ICD-10-CM

## 2021-12-14 NOTE — Progress Notes (Signed)
Office Visit Note   Patient: Courtney Stevenson           Date of Birth: 1941/04/18           MRN: [id number] Visit Date: 12/14/2021              Requested by: Christen Butter, NP [address] [place],  [place] [address] PCP: Christen Butter, NP  Chief Complaint  Patient presents with   Right Leg - Edema      HPI: Patient is a 81 year old woman who is seen for initial evaluation for lymphedema both lower extremities right worse than left.  Patient states the swelling has gotten so bad she cannot bend her knee and has pain with activities of daily living.  Patient has used compression garments for a prolonged period of time without relief.  Patient is also had injections in her right knee for osteoarthritis.  Assessment & Plan: Visit Diagnoses:  1. Lymphedema     Plan: We will have patient set up for a trial with a lymphedema pump in the office we will set this up and have her follow-up for the trial.  Patient will need lymphedema pumps starting at 1 hour a day twice a day initially followed by maintenance of 1 hour a day.  Also recommended using her exercise bike to help the muscle pump decrease the lymphedema.  Follow-Up Instructions: Return in about 1 week (around 12/21/2021).   Ortho Exam  Patient is alert, oriented, no adenopathy, well-dressed, normal affect, normal respiratory effort. Examination the patient is indurated skin on both lower extremities with lymphedema.  There is weeping edema from both legs no cellulitis no open ulcers.  The right calf measures 59 cm in circumference.  Imaging: No results found. No images are attached to the encounter.  Labs: No results found for: "HGBA1C", "ESRSEDRATE", "CRP", "LABURIC", "REPTSTATUS", "GRAMSTAIN", "CULT", "LABORGA"   No results found for: "ALBUMIN", "PREALBUMIN", "CBC"  No results found for: "MG" No results found for: "VD25OH"  No results found for: "PREALBUMIN"    Latest Ref Rng & Units 09/06/2021   12:00 AM  02/01/2021   10:28 AM 07/08/2020    3:10 PM  CBC EXTENDED  WBC 3.8 - 10.8 Thousand/uL 6.9  5.1  10.7   RBC 3.80 - 5.10 Million/uL 4.67  4.97  4.28   Hemoglobin 11.7 - 15.5 g/dL 56.3  87.5  64.3   HCT 35.0 - 45.0 % 42.6  43.6  38.0   Platelets 140 - 400 Thousand/uL 245  284  328   NEUT# 1,500 - 7,800 cells/uL 5,354  3,361  8,881   Lymph# 850 - 3,900 cells/uL 704  995  728      There is no height or weight on file to calculate BMI.  Orders:  No orders of the defined types were placed in this encounter.  No orders of the defined types were placed in this encounter.    Procedures: No procedures performed  Clinical Data: No additional findings.  ROS:  All other systems negative, except as noted in the HPI. Review of Systems  Objective: Vital Signs: There were no vitals taken for this visit.  Specialty Comments:  No specialty comments available.  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) 08/24/2021   Vaginal vault prolapse after hysterectomy 07/12/2021   Atrial fibrillation (HCC) 08/25/2020   Essential hypertension 06/23/2020   Hypothyroidism 06/23/2020   Past Medical History:  Diagnosis Date   A-fib (HCC)    Chronic kidney disease    Hypertension     Family History  Problem Relation Age of Onset   Diabetes Mother    Kidney disease Mother    Kidney disease Father    Skin cancer Other     Past Surgical History:  Procedure Laterality Date   ABDOMINAL HYSTERECTOMY     JOINT REPLACEMENT     TONSILLECTOMY     Social History   Occupational History   Occupation: Retired  Tobacco Use   Smoking status: Never   Smokeless tobacco: Never  Vaping Use    Vaping Use: Never used  Substance and Sexual Activity   Alcohol use: Never   Drug use: Never   Sexual activity: Not Currently    Birth control/protection: Post-menopausal, Abstinence

## 2021-12-15 MED ORDER — CARVEDILOL 6.25 MG PO TABS
6.2500 mg | ORAL_TABLET | Freq: Two times a day (BID) | ORAL | 3 refills | Status: DC
Start: 2021-12-15 — End: 2022-05-31

## 2021-12-16 ENCOUNTER — Ambulatory Visit: Payer: Medicare Other | Admitting: Orthopedic Surgery

## 2021-12-21 ENCOUNTER — Ambulatory Visit (INDEPENDENT_AMBULATORY_CARE_PROVIDER_SITE_OTHER): Payer: Medicare Other

## 2021-12-21 DIAGNOSIS — I890 Lymphedema, not elsewhere classified: Secondary | ICD-10-CM

## 2021-12-22 NOTE — Progress Notes (Signed)
Patient in the office today to meet with [employer] rep for lymphedema pumps. Pt had demo of device and measurements obtained for insurance submission. All questions asked and answered by tech. Will await submission of benefits and pt will follow up as scheduled.  Lylia Karn, RMA,CWCA

## 2021-12-24 ENCOUNTER — Other Ambulatory Visit: Payer: Self-pay | Admitting: Medical-Surgical

## 2021-12-30 IMAGING — DX DG CHEST 2V
2 series · 2 of 2 positions shown · non-contrast
Comparison: Chest x-ray 07/08/2020.

CLINICAL DATA: 79-year-old female with history of wheezing and
dyspnea.

EXAM:
CHEST - 2 VIEW

[chest pa]
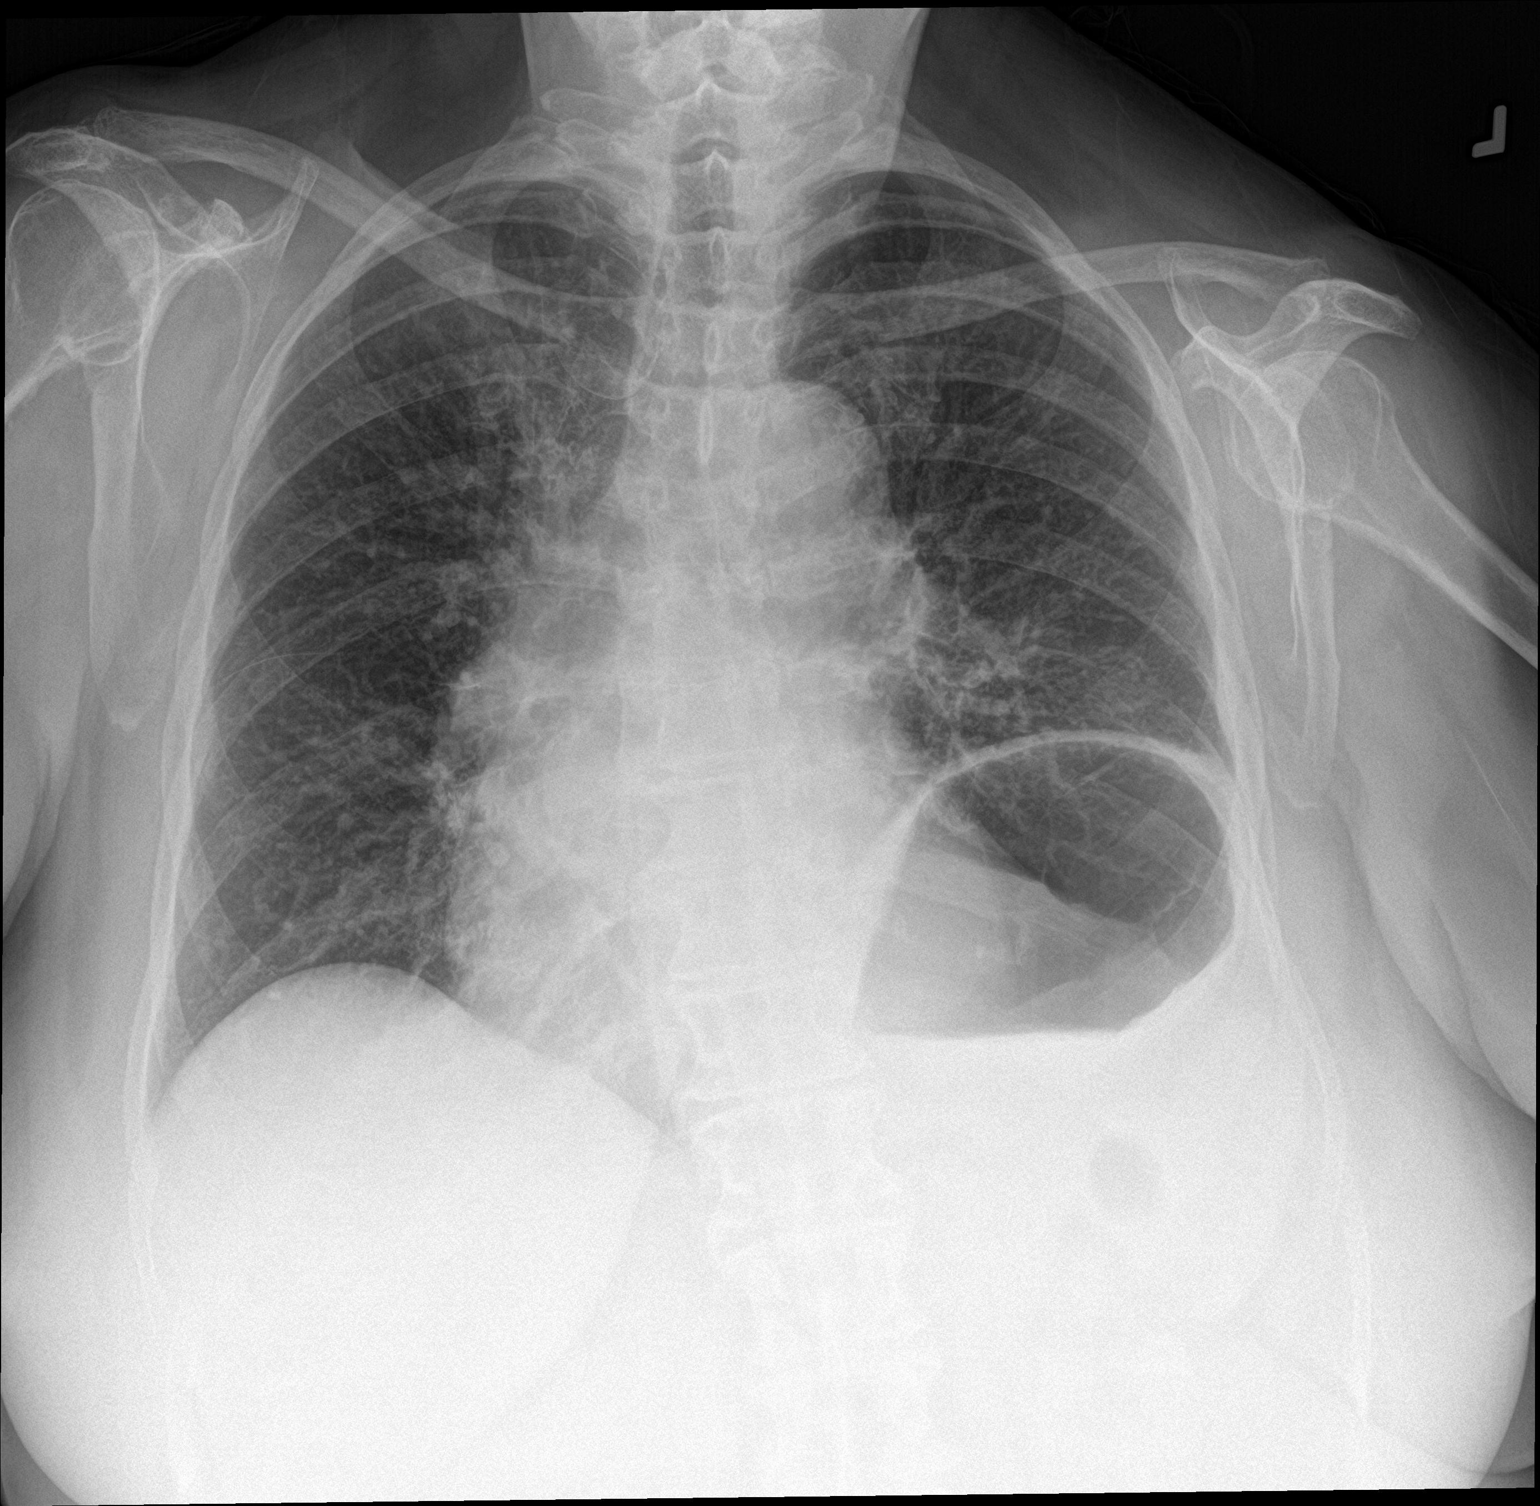

[chest lat]
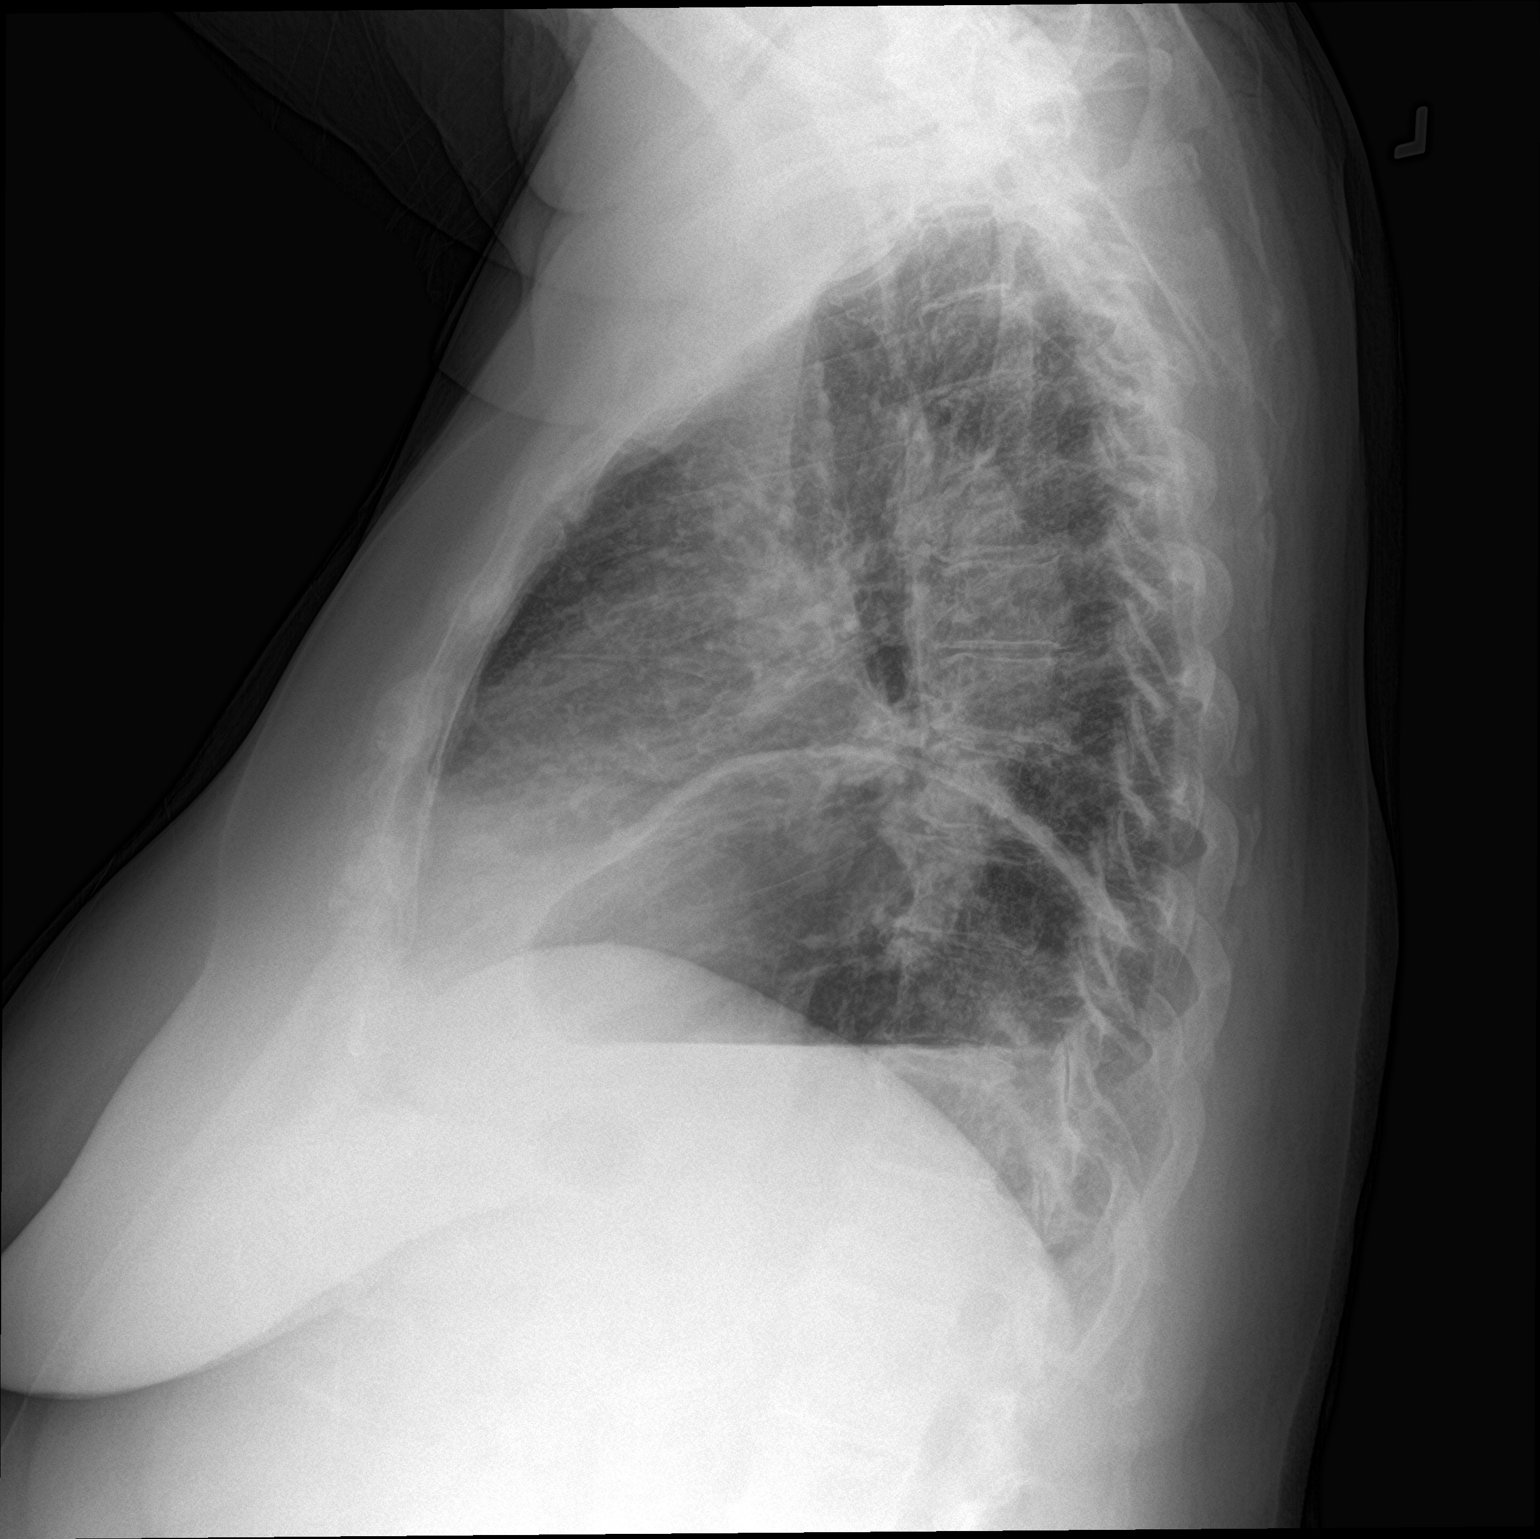

[2 of 2 positions shown; findings below may reference images not displayed]

FINDINGS: Chronic elevation of the left hemidiaphragm, similar to prior
studies. Diffuse peribronchial cuffing in the lungs bilaterally.
Lung volumes are normal. No consolidative airspace disease. No
pleural effusions. No pneumothorax. No pulmonary nodule or mass
noted. Pulmonary vasculature and the cardiomediastinal silhouette
are within normal limits.
IMPRESSION: 1. Diffuse peribronchial cuffing, similar to prior study, likely
reflective of chronic bronchitis.

## 2022-01-12 ENCOUNTER — Encounter: Payer: Self-pay | Admitting: General Practice

## 2022-01-14 ENCOUNTER — Ambulatory Visit: Payer: Medicare Other | Admitting: Family

## 2022-01-14 ENCOUNTER — Encounter: Payer: Self-pay | Admitting: Family

## 2022-01-14 DIAGNOSIS — I890 Lymphedema, not elsewhere classified: Secondary | ICD-10-CM | POA: Diagnosis not present

## 2022-01-14 DIAGNOSIS — R600 Localized edema: Secondary | ICD-10-CM | POA: Diagnosis not present

## 2022-01-14 NOTE — Progress Notes (Addendum)
Office Visit Note   Patient: Courtney Stevenson           Date of Birth: April 19, 1941           MRN: [id number] Visit Date: 01/14/2022              Requested by: Christen Butter, NP [address] [place],  [place] [address] PCP: Christen Butter, NP  Chief Complaint  Patient presents with   Left Leg - Follow-up   Right Leg - Follow-up      HPI: Patient is a 81 year old woman who is seen for follow-up for lymphedema both lower extremities right worse than left.   States she has been using Ace wrap's most recently intermittently for her edema.  Patient states the swelling has gotten so bad she cannot bend her knee and has pain with activities of daily living.  Patient has used compression garments for a prolonged period of time without relief. Despite greater than 4 weeks of home exercise, compression and elevation has failed to have improvement in her LE edema.  Patient is also had injections in her right knee for osteoarthritis.  Assessment & Plan: Visit Diagnoses:  No diagnosis found.   Plan:  Patient will need lymphedema pumps starting at 1 hour a day twice a day initially followed by maintenance of 1 hour a day.  Reminded her to continue using her exercise bike to help the muscle pump decrease the lower extremity edema.  Follow-Up Instructions: Return if symptoms worsen or fail to improve.   Ortho Exam  Patient is alert, oriented, no adenopathy, well-dressed, normal affect, normal respiratory effort. Examination the patient is indurated skin on both lower extremities with lymphedema.  Today there is no weeping. no cellulitis no open ulcers.    Measurements today left calf 50, thigh 60, ankle 25  right thigh 64 calf 51 ankle 29 Imaging: No results found. No images are attached to the encounter.  Labs: No results found for: "HGBA1C", "ESRSEDRATE", "CRP", "LABURIC", "REPTSTATUS", "GRAMSTAIN", "CULT", "LABORGA"   No results found for: "ALBUMIN", "PREALBUMIN",  "CBC"  No results found for: "MG" No results found for: "VD25OH"  No results found for: "PREALBUMIN"    Latest Ref Rng & Units 09/06/2021   12:00 AM 02/01/2021   10:28 AM 07/08/2020    3:10 PM  CBC EXTENDED  WBC 3.8 - 10.8 Thousand/uL 6.9  5.1  10.7   RBC 3.80 - 5.10 Million/uL 4.67  4.97  4.28   Hemoglobin 11.7 - 15.5 g/dL 40.8  14.4  81.8   HCT 35.0 - 45.0 % 42.6  43.6  38.0   Platelets 140 - 400 Thousand/uL 245  284  328   NEUT# 1,500 - 7,800 cells/uL 5,354  3,361  8,881   Lymph# 850 - 3,900 cells/uL 704  995  728      There is no height or weight on file to calculate BMI.  Orders:  No orders of the defined types were placed in this encounter.  No orders of the defined types were placed in this encounter.    Procedures: No procedures performed  Clinical Data: No additional findings.  ROS:  All other systems negative, except as noted in the HPI. Review of Systems  Objective: Vital Signs: There were no vitals taken for this visit.  Specialty Comments:  No specialty comments available.  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) 08/24/2021   Vaginal vault prolapse after hysterectomy 07/12/2021   Atrial fibrillation (HCC) 08/25/2020   Essential hypertension 06/23/2020   Hypothyroidism 06/23/2020   Past Medical History:  Diagnosis Date   A-fib (HCC)    Chronic kidney disease    Hypertension     Family History  Problem Relation Age of Onset   Diabetes Mother    Kidney disease Mother    Kidney disease Father    Skin cancer Other     Past Surgical History:  Procedure Laterality Date   ABDOMINAL HYSTERECTOMY     JOINT REPLACEMENT      TONSILLECTOMY     Social History   Occupational History   Occupation: Retired  Tobacco Use   Smoking status: Never   Smokeless tobacco: Never  Vaping Use   Vaping Use: Never used  Substance and Sexual Activity   Alcohol use: Never   Drug use: Never   Sexual activity: Not Currently    Birth control/protection: Post-menopausal, Abstinence

## 2022-02-03 ENCOUNTER — Other Ambulatory Visit: Payer: Self-pay | Admitting: Medical-Surgical

## 2022-02-20 ENCOUNTER — Other Ambulatory Visit: Payer: Self-pay | Admitting: Medical-Surgical

## 2022-03-03 ENCOUNTER — Encounter: Payer: Self-pay | Admitting: Orthopedic Surgery

## 2022-04-04 ENCOUNTER — Ambulatory Visit: Payer: Medicare Other | Admitting: Obstetrics and Gynecology

## 2022-04-04 ENCOUNTER — Encounter: Payer: Self-pay | Admitting: Obstetrics and Gynecology

## 2022-04-04 VITALS — BP 161/99 | HR 81 | Ht <= 58 in | Wt 180.0 lb

## 2022-04-04 DIAGNOSIS — N8110 Cystocele, unspecified: Secondary | ICD-10-CM

## 2022-04-04 DIAGNOSIS — N816 Rectocele: Secondary | ICD-10-CM | POA: Diagnosis not present

## 2022-04-04 DIAGNOSIS — N3941 Urge incontinence: Secondary | ICD-10-CM

## 2022-04-04 DIAGNOSIS — N993 Prolapse of vaginal vault after hysterectomy: Secondary | ICD-10-CM | POA: Diagnosis not present

## 2022-04-04 NOTE — Progress Notes (Signed)
[facility] New Patient Evaluation and Consultation  Referring Provider: Milas Hock, MD PCP: Christen Butter, NP Date of Service: 04/04/2022  SUBJECTIVE Chief Complaint: New Patient (Initial Visit) Courtney Stevenson is a 81 y.o. female here for a consult for prolapse. Pt has tried several pessary's that have not worked)  History of Present Illness: Courtney Stevenson is a 81 y.o. White or Caucasian female seen in consultation at the request of Dr. Para March for evaluation of prolapse.    Review of records significant for: Has vaginal vault prolapse. #3 ring with support pessary was placed.   Urinary Symptoms: Leaks urine with with movement to the bathroom Leaks at night when she feels the cold tile in the bathroom.  Pad use: none She is not bothered by her UI symptoms.  Day time voids- every few hours.  Nocturia: 2-3 times per night to void. Voiding dysfunction: she empties her bladder well.  does not use a catheter to empty bladder.  When urinating, she feels she has no difficulties Drinks only a few sips of water before bedtime. Takes fluid pill in AM.   UTIs:  0  UTI's in the last year.   Denies history of blood in urine and kidney or bladder stones  Pelvic Organ Prolapse Symptoms:                  She Admits to a feeling of a bulge the vaginal area. It has been present for 1 year.  She Admits to seeing a bulge.  This bulge is bothersome. Has tried some ring pessaries but they would always fall out.   Bowel Symptom: Bowel movements: 1-2 time(s) per day Stool consistency: soft  or loose Straining: no.  Splinting: no.  Incomplete evacuation: yes, sometimes She Denies accidental bowel leakage / fecal incontinence Bowel regimen: none   Sexual Function Sexually active: no.   Pelvic Pain Denies pelvic pain   Past Medical History:  Past Medical History:  Diagnosis Date   A-fib (HCC)    Arthritis    Chronic kidney disease    Hypertension      Past Surgical  History:   Past Surgical History:  Procedure Laterality Date   TONSILLECTOMY     TOTAL HIP ARTHROPLASTY Left    VAGINAL HYSTERECTOMY       Past OB/GYN History: OB History  Gravida Para Term Preterm AB Living  1 1 1  0 0 1  SAB IAB Ectopic Multiple Live Births  0 0 0 0 1    # Outcome Date GA Lbr Len/2nd Weight Sex Delivery Anes PTL Lv  1 Term      Vag-Spont       S/p hysterectomy   Medications: She has a current medication list which includes the following prescription(s): acetaminophen, albuterol, carvedilol, cvs aspirin low dose, furosemide, and sertraline.   Allergies: Patient is allergic to hydrochlorothiazide, sulfanilamide, other, and shingrix [zoster vac recomb adjuvanted].   Social History:  Social History   Tobacco Use   Smoking status: Never   Smokeless tobacco: Never  Vaping Use   Vaping Use: Never used  Substance Use Topics   Alcohol use: Never   Drug use: Never    Relationship status: widowed She lives alone.   She is not employed. Regular exercise: No History of abuse: No  Family History:   Family History  Problem Relation Age of Onset   Diabetes Mother    Kidney disease Mother    Kidney disease Father    Skin  cancer Other      Review of Systems: Review of Systems  Constitutional:  Positive for weight loss. Negative for fever and malaise/fatigue.  Respiratory:  Negative for cough, shortness of breath and wheezing.   Cardiovascular:  Positive for leg swelling. Negative for chest pain and palpitations.  Gastrointestinal:  Negative for abdominal pain and blood in stool.  Genitourinary:  Negative for dysuria.  Musculoskeletal:  Negative for myalgias.  Skin:  Negative for rash.  Neurological:  Negative for dizziness and headaches.  Endo/Heme/Allergies:  Bruises/bleeds easily.  Psychiatric/Behavioral:  Negative for depression. The patient is not nervous/anxious.      OBJECTIVE Physical Exam: Vitals:   04/04/22 0954  BP: (!) 161/99   Pulse: 81  Weight: 180 lb (81.6 kg)  Height: 4\' 10"  (1.473 m)    Physical Exam Constitutional:      General: She is not in acute distress. Pulmonary:     Effort: Pulmonary effort is normal.  Abdominal:     General: There is no distension.     Palpations: Abdomen is soft.     Tenderness: There is no abdominal tenderness. There is no rebound.  Musculoskeletal:        General: No swelling. Normal range of motion.  Skin:    General: Skin is warm and dry.     Findings: No rash.  Neurological:     Mental Status: She is alert and oriented to person, place, and time.  Psychiatric:        Mood and Affect: Mood normal.        Behavior: Behavior normal.      GU / Detailed Urogynecologic Evaluation:  Pelvic Exam: Normal external female genitalia; Bartholin's and Skene's glands normal in appearance; urethral meatus normal in appearance, no urethral masses or discharge.   CST: negative  Complete vaginal prolapse noted. s/p hysterectomy: Normal vaginal mucosa with  atrophy and normal vaginal cuff.  Adnexa no mass, fullness, tenderness.    Pelvic floor strength I/V  Pelvic floor musculature: Right levator non-tender, Right obturator non-tender, Left levator non-tender, Left obturator non-tender  POP-Q:   POP-Q  3                                            Aa   6                                           Ba  7                                              C   6.5                                            Gh  4.5                                            Pb  7  tvl   3                                            Ap  7                                            Bp                                                 D      Rectal Exam:  Normal external rectum  Post-Void Residual (PVR) by Bladder Scan: Pt does not feel need to urinate, no urine sample or PVR taken  ASSESSMENT AND PLAN Courtney Stevenson is a 81 y.o. with:  1.  Vaginal vault prolapse after hysterectomy   2. Prolapse of anterior vaginal wall   3. Prolapse of posterior vaginal wall   4. Urge incontinence    Stage IV anterior, Stage IV posterior, Stage IV apical prolapse - For treatment of pelvic organ prolapse, we discussed options for management including expectant management, conservative management, and surgical management, such as Kegels, a pessary, pelvic floor physical therapy, and specific surgical procedures. - She prefers not to have surgery. We discussed another pessary other than a ring may be an option and she will return for a fitting.  - If a pessary does not work, we discussed the option of Colpocleisis.   2. Urge incontinence - Patient did not feel the urge to void at today's visit. Will have her void for urine sample and obtain PVR at next visit prior to the pessary fitting.  - Will reassess symptoms after pessary placement.   Return for pessary fitting.   Jaquita Folds, MD

## 2022-04-11 ENCOUNTER — Ambulatory Visit: Payer: Medicare Other | Admitting: Obstetrics and Gynecology

## 2022-04-18 NOTE — Progress Notes (Unsigned)
[facility]   Subjective:     Chief Complaint: Vaginal Prolapse  History of Present Illness: Courtney Stevenson is a 81 y.o. female with stage IV pelvic organ prolapse who presents today for a pessary fitting.    Past Medical History: Patient  has a past medical history of A-fib (HCC), Arthritis, Chronic kidney disease, and Hypertension.   Past Surgical History: She  has a past surgical history that includes Tonsillectomy; Total hip arthroplasty (Left); and Vaginal hysterectomy.   Medications: She has a current medication list which includes the following prescription(s): acetaminophen, albuterol, carvedilol, cvs aspirin low dose, furosemide, and sertraline.   Allergies: Patient is allergic to hydrochlorothiazide, sulfanilamide, other, and shingrix [zoster vac recomb adjuvanted].   Social History: Patient  reports that she has never smoked. She has never used smokeless tobacco. She reports that she does not drink alcohol and does not use drugs.      Objective:    BP (!) 146/86 (BP Location: Right Wrist, Patient Position: Sitting, Cuff Size: Small)   Pulse 67  Gen: No apparent distress, A&O x 3. Pelvic Exam: Normal external female genitalia; Bartholin's and Skene's glands normal in appearance; urethral meatus normal in appearance, no urethral masses or discharge.   #4 Gellhorn pessary was attempted and noted to be too small when patient was standing.  #5 Gellhorn was fit well  A size  #5 long stem gellhorn pessary (Lot [id number]) was fitted. It was comfortable, stayed in place with valsalva and was an appropriate size on examination, with one finger fitting between the pessary and the vaginal walls. She was able to urinate and simulate a BM without pessary dislodgement.   Assessment/Plan:    Assessment: Ms. Geisel is a 81 y.o. with stage IV pelvic organ prolapse who presents for a pessary fitting. Plan: She was fitted with a #5 short stem gellhorn pessary. She will  keep the pessary in place until next visit. She will use coconut oil.   Follow-up in 3 weeks for a pessary check or sooner as needed.  All questions were answered.  Patient's blood pressure was noted to be elevated today in the office. Patient's daughter reports she has white coat syndrome and it is frequently elevated. She is monitored by PCP and checks it at home and reports her home readings are good.    Selmer Dominion, NP

## 2022-04-19 ENCOUNTER — Encounter: Payer: Self-pay | Admitting: Obstetrics and Gynecology

## 2022-04-19 ENCOUNTER — Ambulatory Visit (INDEPENDENT_AMBULATORY_CARE_PROVIDER_SITE_OTHER): Payer: Medicare Other | Admitting: Obstetrics and Gynecology

## 2022-04-19 VITALS — BP 146/86 | HR 67

## 2022-04-19 DIAGNOSIS — N8110 Cystocele, unspecified: Secondary | ICD-10-CM | POA: Diagnosis not present

## 2022-04-19 DIAGNOSIS — N816 Rectocele: Secondary | ICD-10-CM

## 2022-04-19 DIAGNOSIS — N993 Prolapse of vaginal vault after hysterectomy: Secondary | ICD-10-CM

## 2022-04-19 DIAGNOSIS — I10 Essential (primary) hypertension: Secondary | ICD-10-CM

## 2022-04-19 NOTE — Patient Instructions (Signed)
Use coconut oil or vitamin E cream in the vagina

## 2022-04-22 ENCOUNTER — Other Ambulatory Visit: Payer: Self-pay

## 2022-04-22 DIAGNOSIS — M1711 Unilateral primary osteoarthritis, right knee: Secondary | ICD-10-CM

## 2022-04-25 ENCOUNTER — Ambulatory Visit: Payer: Medicare Other | Admitting: Surgical

## 2022-04-25 ENCOUNTER — Encounter: Payer: Self-pay | Admitting: Orthopedic Surgery

## 2022-04-25 DIAGNOSIS — M1711 Unilateral primary osteoarthritis, right knee: Secondary | ICD-10-CM | POA: Diagnosis not present

## 2022-04-25 MED ORDER — LIDOCAINE HCL 1 % IJ SOLN
5.0000 mL | INTRAMUSCULAR | Status: AC | PRN
  Administered 2022-04-25: 5 mL

## 2022-04-25 MED ORDER — SODIUM HYALURONATE 60 MG/3ML IX PRSY
60.0000 mg | PREFILLED_SYRINGE | INTRA_ARTICULAR | Status: AC | PRN
  Administered 2022-04-25: 60 mg via INTRA_ARTICULAR

## 2022-04-25 NOTE — Progress Notes (Signed)
   Procedure Note  Patient: Courtney Stevenson             Date of Birth: 07/28/1940           MRN: [id number]             Visit Date: 04/25/2022  Procedures: Visit Diagnoses:  1. Arthritis of right knee     Large Joint Inj: R knee on 04/25/2022 5:19 PM Indications: pain, joint swelling and diagnostic evaluation Details: 18 G 1.5 in needle, superolateral approach  Arthrogram: No  Medications: 5 mL lidocaine 1 %; 60 mg Sodium Hyaluronate 60 MG/3ML Aspirate: 40 mL Outcome: tolerated well, no immediate complications Procedure, treatment alternatives, risks and benefits explained, specific risks discussed. Consent was given by the patient. Immediately prior to procedure a time out was called to verify the correct patient, procedure, equipment, support staff and site/side marked as required. Patient was prepped and draped in the usual sterile fashion.

## 2022-05-03 ENCOUNTER — Encounter: Payer: Self-pay | Admitting: Obstetrics and Gynecology

## 2022-05-10 ENCOUNTER — Encounter: Payer: Self-pay | Admitting: Obstetrics and Gynecology

## 2022-05-10 ENCOUNTER — Ambulatory Visit (INDEPENDENT_AMBULATORY_CARE_PROVIDER_SITE_OTHER): Payer: Medicare Other | Admitting: Obstetrics and Gynecology

## 2022-05-10 VITALS — BP 135/89 | HR 65

## 2022-05-10 DIAGNOSIS — N993 Prolapse of vaginal vault after hysterectomy: Secondary | ICD-10-CM | POA: Diagnosis not present

## 2022-05-10 DIAGNOSIS — Z4689 Encounter for fitting and adjustment of other specified devices: Secondary | ICD-10-CM

## 2022-05-10 DIAGNOSIS — N816 Rectocele: Secondary | ICD-10-CM | POA: Diagnosis not present

## 2022-05-10 DIAGNOSIS — N8110 Cystocele, unspecified: Secondary | ICD-10-CM

## 2022-05-10 DIAGNOSIS — I10 Essential (primary) hypertension: Secondary | ICD-10-CM | POA: Diagnosis not present

## 2022-05-10 NOTE — Patient Instructions (Signed)
Continue to use the coconut oil in the vaginal tract. Call or message on my chart if this pessary falls out (But we shall have hope it does not). If all goes as planned we will see you in 3 months!  I hope you have a wonderful holiday season.

## 2022-05-10 NOTE — Progress Notes (Addendum)
[facility]   Subjective:     Chief Complaint:  Chief Complaint  Patient presents with   Pessary Check   History of Present Illness: Rosaly Labarbera is a 81 y.o. female with stage IV pelvic organ prolapse who presents for a pessary check. She is using a size #5 short stem gellhorn pessary. The pessary was working well but it came out after two weeks. She reports she is very hesitant for surgery due to her age. She would like to try the #6 Gellhorn.  Past Medical History: Patient  has a past medical history of A-fib (HCC), Arthritis, Chronic kidney disease, and Hypertension.   Past Surgical History: She  has a past surgical history that includes Tonsillectomy; Total hip arthroplasty (Left); and Vaginal hysterectomy.   Medications: She has a current medication list which includes the following prescription(s): acetaminophen, albuterol, carvedilol, cvs aspirin low dose, furosemide, and sertraline.   Allergies: Patient is allergic to hydrochlorothiazide, sulfanilamide, other, and shingrix [zoster vac recomb adjuvanted].   Social History: Patient  reports that she has never smoked. She has never used smokeless tobacco. She reports that she does not drink alcohol and does not use drugs.      Objective:    Physical Exam: BP 135/89 (BP Location: Right Wrist)   Pulse 65  Gen: No apparent distress, A&O x 3. Detailed Urogynecologic Evaluation:  Pelvic Exam: Normal external female genitalia; Bartholin's and Skene's glands normal in appearance; urethral meatus normal in appearance, no urethral masses or discharge. The pessary was noted to be dislodged. Speculum exam revealed no lesions in the vagina. The #6 Gellhorn pessary (Lot [id number]) was placed. It was comfortable to the patient and fit well. She is limited in mobility, which is part of the challenge with fitting her. The last one reportedly came out     Assessment/Plan:    Assessment: Ms. Anglada is a 81 y.o. with stage  IV pelvic organ prolapse here for a pessary check. She is doing well.   Plan: She will keep the pessary in place until next visit. She will continue to use coconut oil. She will follow-up in 3 months for a pessary check or sooner as needed.  All questions were answered.

## 2022-05-11 ENCOUNTER — Other Ambulatory Visit: Payer: Self-pay | Admitting: Medical-Surgical

## 2022-05-30 ENCOUNTER — Other Ambulatory Visit: Payer: Self-pay | Admitting: Medical-Surgical

## 2022-05-30 ENCOUNTER — Encounter: Payer: Self-pay | Admitting: Medical-Surgical

## 2022-05-30 ENCOUNTER — Ambulatory Visit (INDEPENDENT_AMBULATORY_CARE_PROVIDER_SITE_OTHER): Payer: Medicare Other | Admitting: Medical-Surgical

## 2022-05-30 VITALS — BP 170/76 | HR 75 | Resp 20 | Ht <= 58 in | Wt 183.0 lb

## 2022-05-30 DIAGNOSIS — I4891 Unspecified atrial fibrillation: Secondary | ICD-10-CM | POA: Diagnosis not present

## 2022-05-30 DIAGNOSIS — H9193 Unspecified hearing loss, bilateral: Secondary | ICD-10-CM | POA: Diagnosis not present

## 2022-05-30 DIAGNOSIS — R600 Localized edema: Secondary | ICD-10-CM

## 2022-05-30 DIAGNOSIS — I10 Essential (primary) hypertension: Secondary | ICD-10-CM

## 2022-05-30 DIAGNOSIS — E039 Hypothyroidism, unspecified: Secondary | ICD-10-CM

## 2022-05-30 DIAGNOSIS — F418 Other specified anxiety disorders: Secondary | ICD-10-CM

## 2022-05-30 NOTE — Progress Notes (Signed)
Established Patient Office Visit  Subjective   Patient ID: Courtney Stevenson, female   DOB: Jun 12, 1940 Age: 82 y.o. MRN: [id number]   Chief Complaint  Patient presents with   Follow-up   Hypertension   HPI Pleasant 82 year old female accompanied by her daughter presenting today for the following:  Hypertension: Taking Coreg 6.25 mg twice daily as prescribed, tolerating well without side effects.  Has blood pressure monitor at home and reports that her home readings have been 120-130/70-80.  Taking Lasix 40 mg daily for lower extremity edema and to help with blood pressure.  She is now using a lymphedema pump Monday through Friday for an hour each day to help with the lower extremity swelling.  She also has a caregiver 5 days a week that helps to wrap her feet with Ace wraps since compression socks/stockings are too difficult to get on.  Doing some home physical therapy to increase her activity but still uses her wheelchair most often.  Denies chest pain, shortness of breath at rest, headaches, dizziness, palpitations, and dizzy spells.  Mood: Taking sertraline 25 mg daily and feels it is working okay.  Unfortunately, her husband passed away unexpectedly at the end of July and she has been dealing with grief.  She feels that she has done fairly well so far however does have good days and bad days.  Not currently doing any counseling and is not interested in doing that for now.  Denies SI/HI.  Her daughter notes that [name]'s hearing has gotten worse lately.  Wants to have her ears checked to make sure there is no wax buildup to explain the hearing loss.   Objective:    Vitals:   05/30/22 1422  BP: (!) 170/76  Pulse: 75  Resp: 20  Height: 4\' 10"  (1.473 m)  Weight: 183 lb 0.6 oz (83 kg)  SpO2: 95%  BMI (Calculated): 38.27    Physical Exam Vitals and nursing note reviewed.  Constitutional:      General: She is not in acute distress.    Appearance: Normal appearance. She is obese. She is  not ill-appearing.  HENT:     Head: Normocephalic and atraumatic.     Right Ear: Tympanic membrane, ear canal and external ear normal. There is no impacted cerumen.     Left Ear: Tympanic membrane, ear canal and external ear normal. There is no impacted cerumen.  Cardiovascular:     Rate and Rhythm: Normal rate. Rhythm irregular.     Pulses: Normal pulses.          Radial pulses are 2+ on the right side and 2+ on the left side.     Heart sounds: Normal heart sounds.  Pulmonary:     Effort: Pulmonary effort is normal. No respiratory distress.     Breath sounds: Normal breath sounds. No wheezing, rhonchi or rales.  Musculoskeletal:     Right lower leg: Edema present.     Left lower leg: Edema present.  Skin:    General: Skin is warm and dry.  Neurological:     Mental Status: She is alert and oriented to person, place, and time.  Psychiatric:        Mood and Affect: Mood normal.        Behavior: Behavior normal.        Thought Content: Thought content normal.        Judgment: Judgment normal.   No results found for this or any previous visit (from the past 24  hour(s)).     The ASCVD Risk score (Arnett DK, et al., 2019) failed to calculate for the following reasons:   The 2019 ASCVD risk score is only valid for ages 54 to 42   Assessment & Plan:   1. Essential hypertension Checking labs as below.  Blood pressure elevated on arrival however she did have a bit of difficulty getting on the scale to get her updated weight today.  Requested recheck by CMA.  Continue Coreg 6.25 mg twice daily and furosemide 40 mg daily as prescribed. - CBC with Differential/Platelet - COMPLETE METABOLIC PANEL WITH GFR  2. Atrial fibrillation, unspecified type (HCC) She is rate controlled however still in A-fib.  Continue Coreg as above.  Continue baby aspirin once daily.  3. Acquired hypothyroidism Checking TSH and free T4. - TSH - T4, free  4. Bilateral lower extremity edema Checking CMP.   Continue lymphedema pump.  Discussed wrapping lower extremities.  Today they are wrapped from the ankle to the knee.  Advised that the wrappings should be started at the toes to prevent worsening of pedal edema.  Continue to limit dietary sodium and elevate legs whenever seated. - COMPLETE METABOLIC PANEL WITH GFR  5. Anxiety with depression Unfortunately, the loss of her husband has greatly impacted her.  She is happy with the medication and is not interested in increased dose or counseling referral.  Advised her to reach out immediately should she change her mind and I will be glad to work with her on this.  6. Bilateral hearing loss, unspecified hearing loss type Ear exam showing no wax buildup.  Referring to audiology. - Ambulatory referral to Audiology   Return in about 6 months (around 11/28/2022) for chronic disease follow up.  ___________________________________________ Thayer Ohm, DNP, APRN, FNP-BC Primary Care and Sports Medicine [facility] [place]

## 2022-05-31 ENCOUNTER — Encounter: Payer: Self-pay | Admitting: Medical-Surgical

## 2022-05-31 LAB — COMPLETE METABOLIC PANEL WITH GFR
AG Ratio: 1.4 (calc) (ref 1.0–2.5)
ALT: 17 U/L (ref 6–29)
AST: 22 U/L (ref 10–35)
Albumin: 3.7 g/dL (ref 3.6–5.1)
Alkaline phosphatase (APISO): 140 U/L (ref 37–153)
BUN/Creatinine Ratio: 23 (calc) — ABNORMAL HIGH (ref 6–22)
BUN: 22 mg/dL (ref 7–25)
CO2: 29 mmol/L (ref 20–32)
Calcium: 9 mg/dL (ref 8.6–10.4)
Chloride: 105 mmol/L (ref 98–110)
Creat: 0.97 mg/dL — ABNORMAL HIGH (ref 0.60–0.95)
Globulin: 2.7 g/dL (calc) (ref 1.9–3.7)
Glucose, Bld: 99 mg/dL (ref 65–99)
Potassium: 4.4 mmol/L (ref 3.5–5.3)
Sodium: 141 mmol/L (ref 135–146)
Total Bilirubin: 0.4 mg/dL (ref 0.2–1.2)
Total Protein: 6.4 g/dL (ref 6.1–8.1)
eGFR: 59 mL/min/{1.73_m2} — ABNORMAL LOW (ref >=60)

## 2022-05-31 LAB — CBC WITH DIFFERENTIAL/PLATELET
Absolute Monocytes: 576 cells/uL (ref 200–950)
Basophils Absolute: 48 cells/uL (ref 0–200)
Basophils Relative: 0.8 %
Eosinophils Absolute: 168 cells/uL (ref 15–500)
Eosinophils Relative: 2.8 %
HCT: 42.1 % (ref 35.0–45.0)
Hemoglobin: 14.4 g/dL (ref 11.7–15.5)
Lymphs Abs: 954 cells/uL (ref 850–3900)
MCH: 31.9 pg (ref 27.0–33.0)
MCHC: 34.2 g/dL (ref 32.0–36.0)
MCV: 93.1 fL (ref 80.0–100.0)
MPV: 10.6 fL (ref 7.5–12.5)
Monocytes Relative: 9.6 %
Neutro Abs: 4254 cells/uL (ref 1500–7800)
Neutrophils Relative %: 70.9 %
Platelets: 204 10*3/uL (ref 140–400)
RBC: 4.52 10*6/uL (ref 3.80–5.10)
RDW: 13.1 % (ref 11.0–15.0)
Total Lymphocyte: 15.9 %
WBC: 6 10*3/uL (ref 3.8–10.8)

## 2022-05-31 LAB — T4, FREE: Free T4: 1 ng/dL (ref 0.8–1.8)

## 2022-05-31 LAB — TSH: TSH: 4.23 mIU/L (ref 0.40–4.50)

## 2022-07-12 DIAGNOSIS — H903 Sensorineural hearing loss, bilateral: Secondary | ICD-10-CM | POA: Diagnosis not present

## 2022-07-15 DIAGNOSIS — H40032 Anatomical narrow angle, left eye: Secondary | ICD-10-CM | POA: Diagnosis not present

## 2022-07-15 DIAGNOSIS — H353232 Exudative age-related macular degeneration, bilateral, with inactive choroidal neovascularization: Secondary | ICD-10-CM | POA: Diagnosis not present

## 2022-07-15 DIAGNOSIS — Z882 Allergy status to sulfonamides status: Secondary | ICD-10-CM | POA: Diagnosis not present

## 2022-07-15 DIAGNOSIS — H25812 Combined forms of age-related cataract, left eye: Secondary | ICD-10-CM | POA: Diagnosis not present

## 2022-07-15 DIAGNOSIS — H2513 Age-related nuclear cataract, bilateral: Secondary | ICD-10-CM | POA: Diagnosis not present

## 2022-07-15 DIAGNOSIS — Z961 Presence of intraocular lens: Secondary | ICD-10-CM | POA: Diagnosis not present

## 2022-07-15 DIAGNOSIS — H43813 Vitreous degeneration, bilateral: Secondary | ICD-10-CM | POA: Diagnosis not present

## 2022-08-03 ENCOUNTER — Encounter: Payer: Self-pay | Admitting: Obstetrics and Gynecology

## 2022-08-10 ENCOUNTER — Ambulatory Visit: Payer: Medicare Other | Admitting: Obstetrics and Gynecology

## 2022-08-11 ENCOUNTER — Telehealth: Payer: Self-pay | Admitting: Medical-Surgical

## 2022-08-11 NOTE — Telephone Encounter (Signed)
Called patient to schedule Medicare Annual Wellness Visit (AWV). Left message for patient to call back and schedule Medicare Annual Wellness Visit (AWV).  Last date of AWV: 12/15/2020  Please schedule an appointment at any time with Nurse Health Advisor.  If any questions, please contact me at [phone].  Thank you ,  Lin Givens Patient Access Advocate II Direct Dial: [phone]

## 2022-08-12 ENCOUNTER — Other Ambulatory Visit: Payer: Self-pay | Admitting: Medical-Surgical

## 2022-08-12 ENCOUNTER — Encounter: Payer: Self-pay | Admitting: Medical-Surgical

## 2022-08-12 DIAGNOSIS — F4321 Adjustment disorder with depressed mood: Secondary | ICD-10-CM

## 2022-08-23 ENCOUNTER — Telehealth: Payer: Self-pay | Admitting: Medical-Surgical

## 2022-08-23 DIAGNOSIS — H40032 Anatomical narrow angle, left eye: Secondary | ICD-10-CM | POA: Diagnosis not present

## 2022-08-23 DIAGNOSIS — Z961 Presence of intraocular lens: Secondary | ICD-10-CM | POA: Diagnosis not present

## 2022-08-23 DIAGNOSIS — H353231 Exudative age-related macular degeneration, bilateral, with active choroidal neovascularization: Secondary | ICD-10-CM | POA: Diagnosis not present

## 2022-08-23 DIAGNOSIS — H25812 Combined forms of age-related cataract, left eye: Secondary | ICD-10-CM | POA: Diagnosis not present

## 2022-08-23 NOTE — Telephone Encounter (Signed)
I spoke with Jana Half, [place], She was getting the patient prepared for cataract eye surgery and she noticed that the patient has little to no air movement in the left lower lobe. The patient has also been losing weight intentionally also. She just wanted to bring this to your attention since the patient doesn't have an appointment with you till July.

## 2022-08-23 NOTE — Telephone Encounter (Signed)
Jana Half, a nurse at [facility] called asking to speak to Samuel Bouche about this pt.    Phone number [phone].

## 2022-08-24 NOTE — Telephone Encounter (Signed)
Left message for patient to call and schedule an appointment as soon as possible.

## 2022-08-26 ENCOUNTER — Other Ambulatory Visit: Payer: Self-pay | Admitting: Medical-Surgical

## 2022-08-26 DIAGNOSIS — H40032 Anatomical narrow angle, left eye: Secondary | ICD-10-CM | POA: Diagnosis not present

## 2022-08-26 DIAGNOSIS — Z961 Presence of intraocular lens: Secondary | ICD-10-CM | POA: Diagnosis not present

## 2022-08-26 DIAGNOSIS — H353231 Exudative age-related macular degeneration, bilateral, with active choroidal neovascularization: Secondary | ICD-10-CM | POA: Diagnosis not present

## 2022-08-26 DIAGNOSIS — H25812 Combined forms of age-related cataract, left eye: Secondary | ICD-10-CM | POA: Diagnosis not present

## 2022-08-28 ENCOUNTER — Other Ambulatory Visit: Payer: Self-pay | Admitting: Medical-Surgical

## 2022-08-30 NOTE — Telephone Encounter (Signed)
Called and left detailed voice mail message on daughter Sue Lush phone ( approved through [facility] to do this) - requesting a return call to schedule a follow up visit.

## 2022-09-02 NOTE — Telephone Encounter (Signed)
Spoke with patient.  She has called the PA who had recommended patient be checked as she did not hear any movement in left lower lobe of lung.  She was told that they were OK to go ahead with her cataract surgery on Monday and do not anticipate any problems and that patient will be fine to come in for evaluation after the surgery.  Patient will call and schedule a visit  with Christen Butter, NP  after she had recovered some from her surgery .

## 2022-09-05 DIAGNOSIS — H25812 Combined forms of age-related cataract, left eye: Secondary | ICD-10-CM | POA: Diagnosis not present

## 2022-09-06 DIAGNOSIS — Z961 Presence of intraocular lens: Secondary | ICD-10-CM | POA: Diagnosis not present

## 2022-09-06 DIAGNOSIS — H25812 Combined forms of age-related cataract, left eye: Secondary | ICD-10-CM | POA: Diagnosis not present

## 2022-09-08 ENCOUNTER — Ambulatory Visit (INDEPENDENT_AMBULATORY_CARE_PROVIDER_SITE_OTHER): Payer: Medicare Other | Admitting: Behavioral Health

## 2022-09-08 ENCOUNTER — Encounter: Payer: Self-pay | Admitting: Behavioral Health

## 2022-09-08 DIAGNOSIS — F4381 Prolonged grief disorder: Secondary | ICD-10-CM

## 2022-09-08 DIAGNOSIS — F4321 Adjustment disorder with depressed mood: Secondary | ICD-10-CM

## 2022-09-08 NOTE — Progress Notes (Signed)
[facility] Counselor Initial Adult Exam  Name: Courtney Stevenson Date: 09/08/2022 MRN: [id number] DOB: [birth year] PCP: Christen Butter, NP  Time spent: 57 minutes via video session.  The patient was at home and this therapist was in his home office.  Guardian/Payee: Self  Paperwork requested: No   Reason for Visit /Presenting Problem: Grief The patient is an 82 year old widowed female who primarily lives alone but has a caregiver in the home with her from 9 AM to 2 PM every day.  She lives in a condo where she is very comfortable and has good neighbors around her.  Her daughter and son-in-law and 15 year old grandson live close by and she speaks to them on a regular basis with a check on her.  She has a 74 year old son whom she describes as a free spirit trying to find himself but he does check on her weekly.  He lives in [place].  On March 12 of this year her 76 year old adopted son died as a result of an overdose.  She said they had all been fearful of the fact that he would overdose but did not expect him to die and that has been very difficult for her to accept and deal with.  She adopted him when he was about 82 years old.  On top of that her husband of 50+ years died on 2024-08-24of last year.  He had double pneumonia and went to the hospital and he also died suddenly and unexpectedly.  The patient acknowledges grief and trying to understand these losses.  She had a great relationship with her husband and with her son.  Her faith is important to her and she has been active in her church for over 50 years.  [place] with biological parents and paternal grandparents who live next door.  She describes her childhood as very stable and positive being a part of a good family.  She was very close to 4 cousins growing up and is still very close to 2 of them who are sisters.  One is just a few days difference in age.  She met her husband through her  husband who was dating her husband's friend at the time.  He was in school for drafting and worked part-time at a funeral home where he lived with his buddy.  He did not go into work for what he was in school for but ended up going into the decorating business.  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  But close to down during the recession of the 80s.  He continued to work until he was 82 years old installing blinds curtains etc.  They adopted one son in 1.  Her biological daughter was born in 22 and they adopted their second; 26 when he was 82 years old.  Her biological daughter and adopted sons mesh did very well.  She and her husband married in 1964 and have a great marriage although she said there were some uphill battles with their adoptive sons and the issues especially that the oldest 1 brought to her home.  She also has 2 grandchildren both of which are 40 years old her grandson Marlene Bast belongs to her daughter and he is doing missions work right now but will go back to college in the fall.  Her adopted son that passed away also had a 91 year old son who is now living with  his mother and she reports a good relationship with him also.  She reports no difficulty with sleep even joking that she at times dozes off during the day.  She reports no significant history of anxiety or depression other than normal life stress.  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  She says it is difficult because her husband did so much for him including cooking working in the yard.  It has been a difficult adjustment for her without him.  She describes her grief as feeling numb and detached and at times says that some of what she has been through just does not feel real.  She said her husband loves to cook and to entertain and raise different plants and being outdoors and she enjoyed being a part of that.  [place] especially loves to fish and hunt and water ski.  She said [place] that being where she is makes it much easier for her physically.  She relies on her supports from church family friends to help her with her grief but says at times that is very difficult.  We talked about what grieving looks like for her and the importance of blending feeling coping distractions and time with family and friends as a part of the grief process.  Mental Status Exam: Appearance:   Well Groomed     Behavior:  Appropriate  Motor:  Normal  Speech/Language:   Clear and Coherent  Affect:  Appropriate  Mood:  sad  Thought process:  normal  Thought content:    WNL  Sensory/Perceptual disturbances:    WNL  Orientation:  oriented to person, place, time/date, situation, day of week, month of year, and year  Attention:  Good  Concentration:  Good  Memory:  WNL  Fund of knowledge:   Good  Insight:    Good  Judgment:   Good  Impulse Control:  Good    Reported Symptoms: Grief  Risk Assessment: Danger to Self:  No Self-injurious Behavior: No Danger to Others: No Duty to Warn:no Physical Aggression / Violence:No  Access to Firearms a concern: No  Gang Involvement:No  Patient / guardian was educated about steps to take if suicide or homicide risk level increases between visits: no While future psychiatric events cannot be accurately predicted, the patient does not currently require acute inpatient psychiatric care and does not currently meet [facility] involuntary commitment criteria.  Substance Abuse History: Current substance abuse: No     Past Psychiatric History:   No previous psychological problems have been observed Outpatient Providers: Primary care physician History of Psych Hospitalization: No  Psychological Testing: Not applicable   Abuse History:  Victim of: No.,  Not applicable    Report  needed: No. Victim of Neglect:No. Perpetrator of No Witness / Exposure to Domestic Violence: No   Protective Services Involvement: No  Witness to [employer] Violence:  No   Family History:  Family History  Problem Relation Age of Onset   Diabetes Mother    Kidney disease Mother    Kidney disease Father    Skin cancer Other     Living situation: the patient lives alone  Sexual Orientation: Straight  Relationship Status: widowed  Name of spouse / other:Grey If a parent, number of children / ages: 2 adult children, an adult  adopted son died in January of this year from an overdose  Support Systems: friends Children, church, neighbors  Surveyor, quantity Stress:  No   Income/Employment/Disability: Neurosurgeon: No   Educational History: Education: high school diploma/GED  Religion/Sprituality/World View: Protestant  Any cultural differences that may affect / interfere with treatment:  not applicable   Recreation/Hobbies: Time with family friends for church  Stressors: Loss of husband and son within a year of each other    Strengths: Supportive Relationships, Family, Church, Spirituality, Hopefulness, and Able to Communicate Effectively  Barriers:     Legal History: Pending legal issue / charges: The patient has no significant history of legal issues. History of legal issue / charges:  Not applicable  Medical History/Surgical History: not reviewed Past Medical History:  Diagnosis Date   A-fib    Arthritis    Chronic kidney disease    Hypertension     Past Surgical History:  Procedure Laterality Date   TONSILLECTOMY     TOTAL HIP ARTHROPLASTY Left    VAGINAL HYSTERECTOMY      Medications: Current Outpatient Medications  Medication Sig Dispense Refill   acetaminophen (TYLENOL) 325 MG tablet Take 650 mg by mouth every 6 (six) hours as needed.     albuterol (VENTOLIN HFA) 108 (90 Base) MCG/ACT inhaler Inhale 2 puffs into the lungs  every 6 (six) hours as needed for wheezing. 2 each 11   carvedilol (COREG) 6.25 MG tablet TAKE 1.5 TABLETS (9.375 MG TOTAL) BY MOUTH 2 (TWO) TIMES DAILY WITH A MEAL. 270 tablet 1   [facility]S ASPIRIN LOW DOSE 81 MG EC tablet TAKE 1 TABLET BY MOUTH EVERY DAY 90 tablet 3   furosemide (LASIX) 40 MG tablet TAKE 1 TABLET BY MOUTH EVERY DAY 90 tablet 1   sertraline (ZOLOFT) 25 MG tablet TAKE 1 TABLET (25 MG TOTAL) BY MOUTH DAILY. 90 tablet 1   No current facility-administered medications for this visit.    Allergies  Allergen Reactions   Hydrochlorothiazide Other (See Comments)    Foot pain/gout exacerbation   Sulfanilamide Nausea Only   Other Other (See Comments)   Shingrix [Zoster Vac Recomb Adjuvanted] Other (See Comments)    Diagnoses:  Complicated grief  Plan of Care: I will meet with the patient every 2 weeks virtually French Ana, [facility]

## 2022-09-08 NOTE — Progress Notes (Signed)
                Courtney Stevenson M Courtney Stevenson, LCMHC 

## 2022-09-09 NOTE — Progress Notes (Unsigned)
[facility]   Subjective:     Chief Complaint: No chief complaint on file.  History of Present Illness: Krystyne Tewksbury is a 82 y.o. female with {PFD symptoms:24771} who presents for a pessary check. She is using a size *** {pessary type:24772} pessary. The pessary has been working well and she has no complaints. She {ACTION; IS/IS ZOX:09604540} using vaginal estrogen. She denies vaginal bleeding.  Past Medical History: Patient  has a past medical history of A-fib, Arthritis, Chronic kidney disease, and Hypertension.   Past Surgical History: She  has a past surgical history that includes Tonsillectomy; Total hip arthroplasty (Left); and Vaginal hysterectomy.   Medications: She has a current medication list which includes the following prescription(s): acetaminophen, albuterol, carvedilol, cvs aspirin low dose, furosemide, and sertraline.   Allergies: Patient is allergic to hydrochlorothiazide, sulfanilamide, other, and shingrix [zoster vac recomb adjuvanted].   Social History: Patient  reports that she has never smoked. She has never used smokeless tobacco. She reports that she does not drink alcohol and does not use drugs.      Objective:    Physical Exam: There were no vitals taken for this visit. Gen: No apparent distress, A&O x 3. Detailed Urogynecologic Evaluation:  Pelvic Exam: Normal external female genitalia; Bartholin's and Skene's glands normal in appearance; urethral meatus {urethra:24773}, no urethral masses or discharge. The pessary was noted to be {in place:24774}. It was removed and cleaned. Speculum exam revealed {vaginal lesions:24775} in the vagina. The pessary was replaced. It was comfortable to the patient and fit well.       No data to display          Laboratory Results: Urine dipstick shows: {ua dip:315374::"negative for all components"}.    Assessment/Plan:    Assessment: Ms. Rosamilia is a 82 y.o. with {PFD symptoms:24771} here for a  pessary check. She is doing well.  Plan: She will {pessary plan:24776}. She will continue to use {lubricant:24777}. She will follow-up in *** {days/wks/mos/yrs:310907} for a pessary check or sooner as needed.  All questions were answered.   Time Spent:

## 2022-09-12 ENCOUNTER — Encounter: Payer: Self-pay | Admitting: Obstetrics and Gynecology

## 2022-09-12 ENCOUNTER — Ambulatory Visit: Payer: Medicare Other | Admitting: Obstetrics and Gynecology

## 2022-09-12 VITALS — BP 134/91 | HR 91

## 2022-09-12 DIAGNOSIS — N993 Prolapse of vaginal vault after hysterectomy: Secondary | ICD-10-CM | POA: Diagnosis not present

## 2022-09-12 DIAGNOSIS — N8110 Cystocele, unspecified: Secondary | ICD-10-CM

## 2022-09-12 DIAGNOSIS — N393 Stress incontinence (female) (male): Secondary | ICD-10-CM | POA: Diagnosis not present

## 2022-09-12 DIAGNOSIS — N816 Rectocele: Secondary | ICD-10-CM

## 2022-09-12 NOTE — Progress Notes (Signed)
[facility] Return Visit  SUBJECTIVE  History of Present Illness: Paitlyn Mcclatchey is a 82 y.o. female seen in follow-up for Prolapse. Plan at last visit was Try a size 7 Gellhorn at this visit.   Patient's previous pessary attempts have not worked. We attempted today to place a size 7 Gellhorn.     Past Medical History: Patient  has a past medical history of A-fib, Arthritis, Chronic kidney disease, and Hypertension.   Past Surgical History: She  has a past surgical history that includes Tonsillectomy; Total hip arthroplasty (Left); and Vaginal hysterectomy.   Medications: She has a current medication list which includes the following prescription(s): acetaminophen, albuterol, carvedilol, cvs aspirin low dose, furosemide, and sertraline.   Allergies: Patient is allergic to hydrochlorothiazide, sulfanilamide, other, and shingrix [zoster vac recomb adjuvanted].   Social History: Patient  reports that she has never smoked. She has never used smokeless tobacco. She reports that she does not drink alcohol and does not use drugs.      OBJECTIVE     Physical Exam: Vitals:   09/12/22 1317 09/12/22 1318  BP: (!) 145/85 (!) 134/91  Pulse: 86 91   Gen: No apparent distress, A&O x 3.  Detailed Urogynecologic Evaluation:  Deferred. Prior exam showed:  POP-Q   3                                            Aa   6                                           Ba   7                                              C    6.5                                            Gh   4.5                                            Pb   7                                            tvl    3                                            Ap   7                                            Bp  D     ASSESSMENT AND PLAN    Ms. Freimark is a 82 y.o. with:  1. Prolapse of posterior vaginal wall   2. 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 SUI (stress urinary incontinence, female)    Attempted today to place a size 7 Gellhorn. There was a palpable stool burden in the posterior wall and pessary was almost immediately dislodged. Unable to fit with pessary. Have previously tried other sizes of Gellhorn, Cube, and Donut without success.   At this point we discussed that if patient wants an intervention it would most likely need to be surgical in nature. Patient had significant urine leakage on exam when she was encouraged to cough or valsalva.   We discussed that the least invasive procedure that would be suggested was a colpocleises which would close the vagina. We also discussed that the urinary leakage would potentially worsen after surgical repair and therefore she should consider between  urethral bulking and surgical sling. Information given on both.   Patient to send MyChart Message or call with decision on whether she wants to plan for a surgical procedure or no intervention.   Patient to return PRN or call to schedule for surgical planning with Dr. Florian Buff.

## 2022-09-12 NOTE — Patient Instructions (Addendum)
Consider options between surgical sling and urethral bulking  Colpocleisis vs no surgical intervention

## 2022-09-13 DIAGNOSIS — Z961 Presence of intraocular lens: Secondary | ICD-10-CM | POA: Diagnosis not present

## 2022-09-13 DIAGNOSIS — Z7952 Long term (current) use of systemic steroids: Secondary | ICD-10-CM | POA: Diagnosis not present

## 2022-09-13 DIAGNOSIS — Z48810 Encounter for surgical aftercare following surgery on the sense organs: Secondary | ICD-10-CM | POA: Diagnosis not present

## 2022-09-13 DIAGNOSIS — Z79899 Other long term (current) drug therapy: Secondary | ICD-10-CM | POA: Diagnosis not present

## 2022-09-15 ENCOUNTER — Encounter: Payer: Self-pay | Admitting: Obstetrics and Gynecology

## 2022-09-19 NOTE — Telephone Encounter (Signed)
Yeah sounds good

## 2022-09-20 NOTE — Telephone Encounter (Signed)
Noted.   Will submit second week of May, 2024 Appointment for next gel injection needs to be after 10/25/2022.

## 2022-09-21 ENCOUNTER — Encounter: Payer: Self-pay | Admitting: Behavioral Health

## 2022-09-21 ENCOUNTER — Ambulatory Visit (INDEPENDENT_AMBULATORY_CARE_PROVIDER_SITE_OTHER): Payer: Medicare Other | Admitting: Behavioral Health

## 2022-09-21 DIAGNOSIS — F4321 Adjustment disorder with depressed mood: Secondary | ICD-10-CM | POA: Diagnosis not present

## 2022-09-21 NOTE — Progress Notes (Signed)
                Sybol Morre M Jini Horiuchi, LCMHC 

## 2022-09-21 NOTE — Progress Notes (Signed)
[facility] Counselor/Therapist Progress Note  Patient ID: Courtney Stevenson, MRN: [id number],    Date: 09/21/2022  Time Spent: 57 minutes spent via video session.  The patient was at home and this therapist was in his home office.  Treatment Type: Individual Therapy  Reported Symptoms: Grief  Mental Status Exam: Appearance:  Well Groomed     Behavior: Appropriate  Motor: Normal  Speech/Language:  Normal Rate  Affect: Appropriate  Mood: normal  Thought process: normal  Thought content:   WNL  Sensory/Perceptual disturbances:   WNL  Orientation: oriented to person, place, time/date, situation, day of week, and month of year  Attention: Good  Concentration: Good  Memory: WNL  Fund of knowledge:  Good  Insight:   Good  Judgment:  Good  Impulse Control: Good   Risk Assessment: Danger to Self:  No Self-injurious Behavior: No Danger to Others: No Duty to Warn:no Physical Aggression / Violence:No  Access to Firearms a concern: No  Gang Involvement:No   Subjective: The patient reports mood has been up and down over the past couple of weeks which she understands is part of grief.  We reviewed what we talked about in the initial intake session.  She acknowledges still grieving her husband's death from the fall 10-23-2021 because it was somewhat unexpected.  Her son's death by overdose was also unexpected.  He had a history of substance abuse but no unexpected overdose.  We also talked about her other son Casimiro Needle today and there is some grief associated with the fact that he is struggling to find his footing and life.  He is ADD and for the most part is medication compliant but is also very hyperactive and per her report "mouthy.".  She says she has had difficulty holding any job for more than several months and has told her that he is failing a life.  She knows that she has done all she can to try to help him and continues to pray for him.  She does have very good supports and her  daughter and son-in-law and 2 grandsons as well as a great neighbors.  Her church and her faith are also very important to her.  We spent time today talking about her husband and son particularly.  We looked at the great relationship with her husband and all that he did and how she is adjusting to doing things that he used to do including cooking and cleaning.  She cannot cook like he can because of her knee.  She does some straightening up around the house as time and energy and pain level permit but cannot clean the way he used to.  For the most part sleep is good and she knows that is benefiting her emotional stability.  She talks to her daughter daily as well as her son weekly.  Today was the day of getting to know the patient and understanding her grief and connection to those that she has lost.  For homework ask her to think about trying to cook Saulsberry steak like her husband did even if she directed her daughter or caregiver as to how to fix that. She does contract for safety having no thoughts of hurting herself or anyone else. Interventions: Cognitive Behavioral Therapy and Grief Therapy  Diagnosis: Complicated grief  Plan: I will meet with the patient every 2 weeks via video session  Treatment plan: We will use elements of cognitive behavioral therapy, dialectical behavior therapy as well as grief therapy to help  reduce the patient's grief by at least 50% with a target date of March 23, 2023.  Goals are to have a healthier response to the loss of her husband and her son over a 47-month period.  Another goal will be for the patient to have less sadness per his self-report as evidenced in therapy notes and allow her through therapy to have less grief and feel less overwhelmed with the losses.  Interventions will be to encourage that telling of the patient's grief story about her husband and son, identify where the patient is and educated her on the different stages of grief, encouraged the  patient to show pictures and memorabilia to help process feelings and process any feelings of guilt that may be associated with the losses.  French Ana, [facility]

## 2022-10-05 ENCOUNTER — Ambulatory Visit (INDEPENDENT_AMBULATORY_CARE_PROVIDER_SITE_OTHER): Payer: Medicare Other | Admitting: Behavioral Health

## 2022-10-05 DIAGNOSIS — F4321 Adjustment disorder with depressed mood: Secondary | ICD-10-CM | POA: Diagnosis not present

## 2022-10-05 NOTE — Progress Notes (Signed)
[facility] Counselor/Therapist Progress Note  Patient ID: Shunika Fillmore, MRN: [id number],    Date: 10/05/2022  Time Spent: 57 minutes spent via video session.  The patient was at home and this therapist was in his home office.  Treatment Type: Individual Therapy  Reported Symptoms: Grief  Mental Status Exam: Appearance:  Well Groomed     Behavior: Appropriate  Motor: Normal  Speech/Language:  Normal Rate  Affect: Appropriate  Mood: normal  Thought process: normal  Thought content:   WNL  Sensory/Perceptual disturbances:   WNL  Orientation: oriented to person, place, time/date, situation, day of week, and month of year  Attention: Good  Concentration: Good  Memory: WNL  Fund of knowledge:  Good  Insight:   Good  Judgment:  Good  Impulse Control: Good   Risk Assessment: Danger to Self:  No Self-injurious Behavior: No Danger to Others: No Duty to Warn:no Physical Aggression / Violence:No  Access to Firearms a concern: No  Gang Involvement:No   Subjective: The patient expressed frustration with her son Casimiro Needle.  She acknowledges that he has mental health issues and is compliant with his mood stabilizers but has been diagnosed with ADD and she does not think he is taking any medication for that.  There have been several impulsive behaviors especially verbally from him.  She says that she is not afraid that he will do anything that would hurt her but is exhausting to her mentally and emotionally when he comes over.  He typically only comes over once a week.  We talked about setting boundaries with him in terms of what topics of conversation could be and if he does get worked up verbally what will take place.  The patient says she feels she cannot enforce those but will also speak to her daughter about helping her enforce those and set boundaries.  Also encouraged her to reach out to his psychiatrist her PCP to give them some wisdom as to his impulsive behaviors so that  they may properly treat that.  She also had a conversation with a friend of her son Orvilla Fus who passed away in 08/27/2022.  There were a lot of questions as he was found alone having overdosed.  She had a conversation with him previously about death and did he have a place to be married and that made her question but that friend told her that he had began making plans to do things and that he had intentionally gone into withdrawal 2 times to try to break the grip that drugs had on him.  I did give the patient some hope that his overdose was not intentional.  She continues to grieve the death of her son and husband and we will process that more in future sessions.  She does contract for safety having no thoughts of hurting herself or anyone else. Interventions: Cognitive Behavioral Therapy and Grief Therapy  Diagnosis: Complicated grief  Plan: I will meet with the patient every 2 weeks via video session  Treatment plan: We will use elements of cognitive behavioral therapy, dialectical behavior therapy as well as grief therapy to help reduce the patient's grief by at least 50% with a target date of March 23, 2023.  Goals are to have a healthier response to the loss of her husband and her son over a 57-month period.  Another goal will be for the patient to have less sadness per his self-report as evidenced in therapy notes and allow her through therapy to have less  grief and feel less overwhelmed with the losses.  Interventions will be to encourage that telling of the patient's grief story about her husband and son, identify where the patient is and educated her on the different stages of grief, encouraged the patient to show pictures and memorabilia to help process feelings and process any feelings of guilt that may be associated with the losses.  French Ana, [facility]                 French Ana, [facility]

## 2022-10-06 ENCOUNTER — Encounter: Payer: Self-pay | Admitting: Obstetrics and Gynecology

## 2022-10-12 ENCOUNTER — Encounter: Payer: Self-pay | Admitting: Obstetrics and Gynecology

## 2022-10-13 ENCOUNTER — Encounter: Payer: Self-pay | Admitting: Obstetrics and Gynecology

## 2022-10-18 DIAGNOSIS — Z961 Presence of intraocular lens: Secondary | ICD-10-CM | POA: Diagnosis not present

## 2022-10-18 DIAGNOSIS — Z4889 Encounter for other specified surgical aftercare: Secondary | ICD-10-CM | POA: Diagnosis not present

## 2022-10-19 ENCOUNTER — Ambulatory Visit (INDEPENDENT_AMBULATORY_CARE_PROVIDER_SITE_OTHER): Payer: Medicare Other | Admitting: Behavioral Health

## 2022-10-19 ENCOUNTER — Telehealth: Payer: Self-pay

## 2022-10-19 ENCOUNTER — Encounter: Payer: Self-pay | Admitting: Behavioral Health

## 2022-10-19 DIAGNOSIS — F4321 Adjustment disorder with depressed mood: Secondary | ICD-10-CM | POA: Diagnosis not present

## 2022-10-19 NOTE — Telephone Encounter (Signed)
VOB submitted for Durolane, right knee.  

## 2022-10-19 NOTE — Progress Notes (Signed)
[facility] Counselor/Therapist Progress Note  Patient ID: Meron Nohl, MRN: [id number],    Date: 10/19/2022  Time Spent: 57 minutes spent via video session.  The patient was at home and this therapist was in his home office.  Treatment Type: Individual Therapy  Reported Symptoms: Grief  Mental Status Exam: Appearance:  Well Groomed     Behavior: Appropriate  Motor: Normal  Speech/Language:  Normal Rate  Affect: Appropriate  Mood: normal  Thought process: normal  Thought content:   WNL  Sensory/Perceptual disturbances:   WNL  Orientation: oriented to person, place, time/date, situation, day of week, and month of year  Attention: Good  Concentration: Good  Memory: WNL  Fund of knowledge:  Good  Insight:   Good  Judgment:  Good  Impulse Control: Good   Risk Assessment: Danger to Self:  No Self-injurious Behavior: No Danger to Others: No Duty to Warn:no Physical Aggression / Violence:No  Access to Firearms a concern: No  Gang Involvement:No   Subjective: The past week or so has been difficult for the patient because of her son Casimiro Needle.  There was a very difficult conversation in which she said some very unkind things to her.  She set a very clear boundaries with him telling him that she was not going to continue the conversation on the phone.  She said much as he typically does he call back later and told her that he loved her and left the conversation at that.  He has been in contact with the patient's daughter but the patient clearly told him that she needed a break from him and did not want him to call or come to her home for now.  She and her daughter have had multiple conversations about the best way to deal with her son and we looked at some of the options that they are considering including helping financially, having her daughter reach out to her son psychiatrist so that he has a better feel for what is going on with her son.  He is on a new medication and  reports to the patient's daughter that he feels better but he is still making poor decisions.  He does not have a job and has lost multiple jobs.  He has an apartment but the rent is due next week for another month and the patient is considering helping financially and we talked about the pros and cons of doing that.  Also recommended that she and/or her daughter reach out to vocational rehab and will provide her contact information for that office in [facility].  Also reminded her of the importance of setting clear boundaries with her son for her own mental and emotional health and to continue using coping skills as we have practiced them in previous sessions. She does contract for safety having no thoughts of hurting herself or anyone else. Interventions: Cognitive Behavioral Therapy and Grief Therapy  Diagnosis: Complicated grief  Plan: I will meet with the patient every 2 weeks via video session  Treatment plan: We will use elements of cognitive behavioral therapy, dialectical behavior therapy as well as grief therapy to help reduce the patient's grief by at least 50% with a target date of March 23, 2023.  Goals are to have a healthier response to the loss of her husband and her son over a 22-month period.  Another goal will be for the patient to have less sadness per his self-report as evidenced in therapy notes and allow her through therapy  to have less grief and feel less overwhelmed with the losses.  Interventions will be to encourage that telling of the patient's grief story about her husband and son, identify where the patient is and educated her on the different stages of grief, encouraged the patient to show pictures and memorabilia to help process feelings and process any feelings of guilt that may be associated with the losses.  French Ana, [facility]                 French Ana, [facility]               French Ana, [facility]

## 2022-10-19 NOTE — Telephone Encounter (Signed)
Called and left a VM for patient to CB to schedule for gel injection after 10/25/2022 with Dr. August Saucer or Franky Macho.

## 2022-10-31 ENCOUNTER — Other Ambulatory Visit: Payer: Self-pay

## 2022-10-31 DIAGNOSIS — M1711 Unilateral primary osteoarthritis, right knee: Secondary | ICD-10-CM

## 2022-11-02 ENCOUNTER — Ambulatory Visit (INDEPENDENT_AMBULATORY_CARE_PROVIDER_SITE_OTHER): Payer: Medicare Other | Admitting: Behavioral Health

## 2022-11-02 ENCOUNTER — Encounter: Payer: Medicare Other | Admitting: Obstetrics and Gynecology

## 2022-11-02 ENCOUNTER — Encounter: Payer: Self-pay | Admitting: Behavioral Health

## 2022-11-02 DIAGNOSIS — F4321 Adjustment disorder with depressed mood: Secondary | ICD-10-CM | POA: Diagnosis not present

## 2022-11-02 NOTE — Progress Notes (Signed)
Courtney Stevenson Progress Stevenson  Patient ID: Courtney Stevenson, MRN: [id number],    Date: 11/02/2022  Time Spent: 57 minutes spent via video session.  The patient was at home and this Courtney was in his home office.  Treatment Type: Individual Therapy  Reported Symptoms: Grief  Mental Status Exam: Appearance:  Well Groomed     Behavior: Appropriate  Motor: Normal  Speech/Language:  Normal Rate  Affect: Appropriate  Mood: normal  Thought process: normal  Thought content:   WNL  Sensory/Perceptual disturbances:   WNL  Orientation: oriented to person, place, time/date, situation, day of week, and month of year  Attention: Good  Concentration: Good  Memory: WNL  Fund of knowledge:  Good  Insight:   Good  Judgment:  Good  Impulse Control: Good   Risk Assessment: Danger to Self:  No Self-injurious Behavior: No Danger to Others: No Duty to Warn:no Physical Aggression / Violence:No  Access to Firearms a concern: No  Gang Involvement:No   Subjective: The patient has not spoken to her son's feeling that based on the last conversation he had she is not ready to do so.  The patient's daughter is speaking to her son.  The patient did decide to help them out financially for 1 more month with the condition that he continues to look for work so that he can support himself.  We will look more at his history of mental health issues and the difficult trials that some of his choices growing up which she and her husband through.  She acknowledges some guilt and not being able to care for her son but also is able to balance that with the fact that she knows he has mental health issues has made it is making some poor choices and that supporting him financially is all that she can do at this point in time.  She says she taught him how to use and respect and she knows she can change his mind about decisions that he is making by we will continue to pray for him.  I reminded her  that she was and is still a very good parent and acknowledge the difficulty in balancing care for him versus knowing that he needs to take responsibility.  Interventions: Cognitive Behavioral Therapy and Grief Therapy  Diagnosis: Complicated grief  Plan: I will meet with the patient every 2 weeks via video session  Treatment plan: We will use elements of cognitive behavioral therapy, dialectical behavior therapy as well as grief therapy to help reduce the patient's grief by at least 50% with a target date of March 23, 2023.  Goals are to have a healthier response to the loss of her husband and her son over a 63-month period.  Another goal will be for the patient to have less sadness per his self-report as evidenced in therapy notes and allow her through therapy to have less grief and feel less overwhelmed with the losses.  Interventions will be to encourage that telling of the patient's grief story about her husband and son, identify where the patient is and educated her on the different stages of grief, encouraged the patient to show pictures and memorabilia to help process feelings and process any feelings of guilt that may be associated with the losses.  Courtney Stevenson, [facility]  Courtney Stevenson, Courtney Stevenson

## 2022-11-10 ENCOUNTER — Ambulatory Visit: Payer: Medicare Other | Admitting: Orthopedic Surgery

## 2022-11-16 ENCOUNTER — Ambulatory Visit (INDEPENDENT_AMBULATORY_CARE_PROVIDER_SITE_OTHER): Payer: Medicare Other | Admitting: Behavioral Health

## 2022-11-16 ENCOUNTER — Encounter: Payer: Self-pay | Admitting: Behavioral Health

## 2022-11-16 DIAGNOSIS — F4321 Adjustment disorder with depressed mood: Secondary | ICD-10-CM

## 2022-11-16 DIAGNOSIS — F4381 Prolonged grief disorder: Secondary | ICD-10-CM

## 2022-11-16 NOTE — Progress Notes (Signed)
[facility] Counselor/Therapist Progress Note  Patient ID: Karla Pavone, MRN: [id number],    Date: 11/16/2022  Time Spent: 57 minutes, 10:03 AM until 11 AM.This session was held via video teletherapy. The patient consented to the video teletherapy and was located in her home during this session. She is aware it is the responsibility of the patient to secure confidentiality on her end of the session. The provider was in a private home office for the duration of this session.      Treatment Type: Individual Therapy  Reported Symptoms: Grief  Mental Status Exam: Appearance:  Well Groomed     Behavior: Appropriate  Motor: Normal  Speech/Language:  Normal Rate  Affect: Appropriate  Mood: normal  Thought process: normal  Thought content:   WNL  Sensory/Perceptual disturbances:   WNL  Orientation: oriented to person, place, time/date, situation, day of week, and month of year  Attention: Good  Concentration: Good  Memory: WNL  Fund of knowledge:  Good  Insight:   Good  Judgment:  Good  Impulse Control: Good   Risk Assessment: Danger to Self:  No Self-injurious Behavior: No Danger to Others: No Duty to Warn:no Physical Aggression / Violence:No  Access to Firearms a concern: No  Gang Involvement:No   Subjective: The patient has not spoken to her son since our last session her daughter stays in constant touch with him.  The patient knows that is what has been best for him but she feels that she is ready to reach out to him and we talked about what would be better either in person or by phone.  She recognizes typical responses for him when she tries to have these conversations with him as we talked about how she approaches that what boundaries she can set in that conversation and what she wants the conversation to look like.  She has been thinking seriously about what kind of boundaries to set with him in terms of how often she sees him, how much she helps him out  financially and when they are together what that looks like and weight that the patient is comfortable.  She also has been thinking a lot about her feeling in terms of grief and being with family.  She and her husband lived at [facility] house for over 20 years and it was decided for a lot of family gatherings where she and her husband connected with siblings nieces and nephews as well as her children connecting with her cousins.  When they decided to sell the house because it was getting to the point physically where it was tough for them it was sad for everyone including extended family.  There have been several things over the past few years including losing her son and her husband when they were not able to gather his family but they did so recently and said everyone had a great time.  She [place] and even [place] now wants to reconnect with family.  Her grandson will be home in a couple of weeks but only be home for a few weeks so we talked about setting up a family get-together for anyone who could come in doing that on a regular basis so as a way to connect with family and share memories including pictures, stories etc. The patient does contract for safety having no thoughts of hurting herself or anyone else. Interventions: Cognitive Behavioral Therapy  and Grief Therapy  Diagnosis: Complicated grief  Plan: I will meet with the patient every 2 weeks via video session  Treatment plan: We will use elements of cognitive behavioral therapy, dialectical behavior therapy as well as grief therapy to help reduce the patient's grief by at least 50% with a target date of March 23, 2023.  Goals are to have a healthier response to the loss of her husband and her son over a 60-month period.  Another goal will be for the patient to have less sadness per his self-report as evidenced in therapy notes and allow her through therapy to  have less grief and feel less overwhelmed with the losses.  Interventions will be to encourage that telling of the patient's grief story about her husband and son, identify where the patient is and educated her on the different stages of grief, encouraged the patient to show pictures and memorabilia to help process feelings and process any feelings of guilt that may be associated with the losses. Progress: 30% French Ana, [facility]                                               French Ana, [facility]

## 2022-11-17 ENCOUNTER — Ambulatory Visit: Payer: Medicare Other | Admitting: Orthopedic Surgery

## 2022-11-18 DIAGNOSIS — H40032 Anatomical narrow angle, left eye: Secondary | ICD-10-CM | POA: Diagnosis not present

## 2022-11-18 DIAGNOSIS — H353231 Exudative age-related macular degeneration, bilateral, with active choroidal neovascularization: Secondary | ICD-10-CM | POA: Diagnosis not present

## 2022-11-18 DIAGNOSIS — H25812 Combined forms of age-related cataract, left eye: Secondary | ICD-10-CM | POA: Diagnosis not present

## 2022-11-18 DIAGNOSIS — Z961 Presence of intraocular lens: Secondary | ICD-10-CM | POA: Diagnosis not present

## 2022-11-21 ENCOUNTER — Ambulatory Visit: Payer: Medicare Other | Admitting: Surgical

## 2022-11-21 DIAGNOSIS — M1711 Unilateral primary osteoarthritis, right knee: Secondary | ICD-10-CM | POA: Diagnosis not present

## 2022-11-25 ENCOUNTER — Encounter: Payer: Self-pay | Admitting: Surgical

## 2022-11-25 MED ORDER — SODIUM HYALURONATE 60 MG/3ML IX PRSY
60.0000 mg | PREFILLED_SYRINGE | INTRA_ARTICULAR | Status: AC | PRN
  Administered 2022-11-21: 60 mg via INTRA_ARTICULAR

## 2022-11-25 MED ORDER — LIDOCAINE HCL 1 % IJ SOLN
5.0000 mL | INTRAMUSCULAR | Status: AC | PRN
  Administered 2022-11-21: 5 mL

## 2022-11-25 NOTE — Progress Notes (Signed)
   Procedure Note  Patient: Courtney Stevenson             Date of Birth: [birth year]           MRN: [id number]             Visit Date: 11/21/2022  Procedures: Visit Diagnoses: No diagnosis found.  Large Joint Inj: R knee on 11/21/2022 3:34 PM Indications: pain, joint swelling and diagnostic evaluation Details: 18 G 1.5 in needle, superolateral approach  Arthrogram: No  Medications: 5 mL lidocaine 1 %; 60 mg Sodium Hyaluronate 60 MG/3ML Aspirate: 25 mL Outcome: tolerated well, no immediate complications Procedure, treatment alternatives, risks and benefits explained, specific risks discussed. Consent was given by the patient. Immediately prior to procedure a time out was called to verify the correct patient, procedure, equipment, support staff and site/side marked as required. Patient was prepped and draped in the usual sterile fashion.

## 2022-11-26 ENCOUNTER — Other Ambulatory Visit: Payer: Self-pay | Admitting: Medical-Surgical

## 2022-11-28 ENCOUNTER — Ambulatory Visit (INDEPENDENT_AMBULATORY_CARE_PROVIDER_SITE_OTHER): Payer: Medicare Other | Admitting: Medical-Surgical

## 2022-11-28 ENCOUNTER — Encounter: Payer: Self-pay | Admitting: Medical-Surgical

## 2022-11-28 VITALS — BP 119/78 | HR 78 | Resp 20 | Ht <= 58 in | Wt 173.1 lb

## 2022-11-28 DIAGNOSIS — E039 Hypothyroidism, unspecified: Secondary | ICD-10-CM

## 2022-11-28 DIAGNOSIS — F418 Other specified anxiety disorders: Secondary | ICD-10-CM | POA: Diagnosis not present

## 2022-11-28 DIAGNOSIS — N1832 Chronic kidney disease, stage 3b: Secondary | ICD-10-CM

## 2022-11-28 DIAGNOSIS — I10 Essential (primary) hypertension: Secondary | ICD-10-CM | POA: Diagnosis not present

## 2022-11-28 MED ORDER — CARVEDILOL 6.25 MG PO TABS: ORAL_TABLET | ORAL | 1 refills | Status: DC

## 2022-11-28 MED ORDER — SERTRALINE HCL 25 MG PO TABS: 25.0000 mg | ORAL_TABLET | Freq: Every day | ORAL | 1 refills | Status: DC

## 2022-11-28 NOTE — Progress Notes (Signed)
        Established patient visit  History, exam, impression, and plan:  1. Essential hypertension Courtney Stevenson 82 year old female accompanied by her daughter presenting for follow up on HTN. Currently taking Coreg 9.375mg  twice daily, tolerating well without side effects. Also taking Lasix 40mg  daily. Has been monitoring blood pressure intermittently at home. Follow a low sodium diet. See below for physical exam. No shortness of breath, HA, vision changes, dizziness, or syncope. BP at goal today. Continue Coreg and Lasix as prescribed.   2. Stage 3b chronic kidney disease (HCC) Rechecking kidney function.  - BASIC METABOLIC PANEL WITH GFR  3. Acquired hypothyroidism Not currently on medications. Rechecking TSH.  - TSH  4. Anxiety with depression Taking Zoloft 25mg  daily, tolerating well without side effects. Has also been doing counseling as it is nearing the year anniversary of her husband's passing and she recently lost her son to overdose. Feels that her mood is doing fairly well and does not desire a change in medications. Denies SI/HI. Continue Zoloft as prescribed. Continue counseling.   Procedures performed this visit: None.  Return in about 6 months (around 05/31/2023) for chronic disease follow up.  __________________________________ Thayer Ohm, DNP, APRN, FNP-BC Primary Care and Sports Medicine [facility] [place]

## 2022-11-29 LAB — BASIC METABOLIC PANEL WITH GFR
BUN/Creatinine Ratio: 27 (calc) — ABNORMAL HIGH (ref 6–22)
BUN: 30 mg/dL — ABNORMAL HIGH (ref 7–25)
CO2: 26 mmol/L (ref 20–32)
Calcium: 8.6 mg/dL (ref 8.6–10.4)
Chloride: 100 mmol/L (ref 98–110)
Creat: 1.11 mg/dL — ABNORMAL HIGH (ref 0.60–0.95)
Glucose, Bld: 141 mg/dL — ABNORMAL HIGH (ref 65–99)
Potassium: 4.2 mmol/L (ref 3.5–5.3)
Sodium: 139 mmol/L (ref 135–146)
eGFR: 50 mL/min/{1.73_m2} — ABNORMAL LOW (ref >=60)

## 2022-11-29 LAB — TSH: TSH: 2.27 mIU/L (ref 0.40–4.50)

## 2022-12-08 DIAGNOSIS — L570 Actinic keratosis: Secondary | ICD-10-CM | POA: Diagnosis not present

## 2022-12-08 DIAGNOSIS — R21 Rash and other nonspecific skin eruption: Secondary | ICD-10-CM | POA: Diagnosis not present

## 2022-12-08 DIAGNOSIS — Z85828 Personal history of other malignant neoplasm of skin: Secondary | ICD-10-CM | POA: Diagnosis not present

## 2022-12-15 ENCOUNTER — Encounter: Payer: Self-pay | Admitting: Behavioral Health

## 2022-12-15 ENCOUNTER — Ambulatory Visit: Payer: Medicare Other | Admitting: Behavioral Health

## 2022-12-15 DIAGNOSIS — F4321 Adjustment disorder with depressed mood: Secondary | ICD-10-CM

## 2022-12-15 DIAGNOSIS — F4381 Prolonged grief disorder: Secondary | ICD-10-CM | POA: Diagnosis not present

## 2022-12-15 NOTE — Progress Notes (Signed)
[facility] Counselor/Therapist Progress Note  Patient ID: Courtney Stevenson, MRN: [id number],    Date: 12/15/2022  Time Spent: 55 minutes, 10:05 AM until 11 AM.This session was held via video teletherapy. The patient consented to the video teletherapy and was located in her home during this session. She is aware it is the responsibility of the patient to secure confidentiality on her end of the session. The provider was in a private home office for the duration of this session.      Treatment Type: Individual Therapy  Reported Symptoms: Grief  Mental Status Exam: Appearance:  Well Groomed     Behavior: Appropriate  Motor: Normal  Speech/Language:  Normal Rate  Affect: Appropriate  Mood: normal  Thought process: normal  Thought content:   WNL  Sensory/Perceptual disturbances:   WNL  Orientation: oriented to person, place, time/date, situation, day of week, and month of year  Attention: Good  Concentration: Good  Memory: WNL  Fund of knowledge:  Good  Insight:   Good  Judgment:  Good  Impulse Control: Good   Risk Assessment: Danger to Self:  No Self-injurious Behavior: No Danger to Others: No Duty to Warn:no Physical Aggression / Violence:No  Access to Firearms a concern: No  Gang Involvement:No   Subjective: The patient had some difficulty initially connecting to care agility so the session started about 5 minutes late.  She still has not spoken to her son although her daughter stays in touch with her son.  She has been thinking about it and we processed what she wants to say to him and how she wants to say that.  She does not want to upset him or making mad is his way of dealing with things is to just leave the conversation or get upset and she does not want that to happen.  We talked about some different things that she could say and the best way to approach that.  She does feel he has been more stable over the past 6 months and does not want to disrupt that.  He  is still working and for the most part with a little help able to pay his bills and appears to be more stable on this medication and she is thankful for that.  It is a time of mixed emotions.  Her grandson who has been traveling and being out of the country a good amount of the past year is coming home this afternoon so she and her daughter are excited to see him.  She wants to spend as much time with him as possible before he goes back to college for his senior year.  July 29 is the anniversary of her husband's death and we talked about ways that she can remember and commemorate his life.  She acknowledges that there are days where she is just tearful and days where she remembers good things about their lives together and they go better.  We talked about what grief looks like and feels like to her and it does appear that she is handling that in a very healthy way.  The patient does contract for safety having no thoughts of hurting herself or anyone else. Interventions: Cognitive Behavioral Therapy and Grief Therapy  Diagnosis: Complicated grief  Plan: I will meet with the patient every 2 weeks via video session  Treatment plan: We will use elements of cognitive behavioral therapy, dialectical behavior therapy as well as grief therapy to help reduce the patient's grief by at least 50%  with a target date of March 23, 2023.  Goals are to have a healthier response to the loss of her husband and her son over a 38-month period.  Another goal will be for the patient to have less sadness per his self-report as evidenced in therapy notes and allow her through therapy to have less grief and feel less overwhelmed with the losses.  Interventions will be to encourage that telling of the patient's grief story about her husband and son, identify where the patient is and educated her on the different stages of grief, encouraged the patient to show pictures and memorabilia to help process feelings and process any  feelings of guilt that may be associated with the losses. Progress: 30% French Ana, [facility]                                               French Ana, [facility]                    French Ana, [facility]

## 2022-12-21 ENCOUNTER — Ambulatory Visit: Payer: Medicare Other | Admitting: Obstetrics and Gynecology

## 2023-01-26 ENCOUNTER — Encounter: Payer: Self-pay | Admitting: Behavioral Health

## 2023-01-26 ENCOUNTER — Ambulatory Visit: Payer: Medicare Other | Admitting: Behavioral Health

## 2023-01-26 DIAGNOSIS — F411 Generalized anxiety disorder: Secondary | ICD-10-CM

## 2023-01-26 DIAGNOSIS — F4321 Adjustment disorder with depressed mood: Secondary | ICD-10-CM

## 2023-01-26 DIAGNOSIS — F4381 Prolonged grief disorder: Secondary | ICD-10-CM

## 2023-01-26 NOTE — Progress Notes (Signed)
[facility] Counselor/Therapist Progress Note  Patient ID: Cotina Sjostrom, MRN: [id number],    Date: 01/26/2023  Time Spent: 55 minutes, 10:05 AM until 11 AM.This session was held via video teletherapy. The patient consented to the video teletherapy and was located in her home during this session. She is aware it is the responsibility of the patient to secure confidentiality on her end of the session. The provider was in a private home office for the duration of this session.      Treatment Type: Individual Therapy  Reported Symptoms: Grief  Mental Status Exam: Appearance:  Well Groomed     Behavior: Appropriate  Motor: Normal  Speech/Language:  Normal Rate  Affect: Appropriate  Mood: normal  Thought process: normal  Thought content:   WNL  Sensory/Perceptual disturbances:   WNL  Orientation: oriented to person, place, time/date, situation, day of week, and month of year  Attention: Good  Concentration: Good  Memory: WNL  Fund of knowledge:  Good  Insight:   Good  Judgment:  Good  Impulse Control: Good   Risk Assessment: Danger to Self:  No Self-injurious Behavior: No Danger to Others: No Duty to Warn:no Physical Aggression / Violence:No  Access to Firearms a concern: No  Gang Involvement:No   Subjective: The patient has seen her son since our last session and said there was some tension there but for the most part the visit went well.  Most of the conversation was very casual in general in nature.  She still is setting very healthy and clear boundaries with him although he has made some poor decisions over the past few weeks and she and her daughter have had to help him out.  She tried to get him to go to vocational rehabilitation to help with resume writing, job coaching and finding a job but he refused saying he could do it himself.  She reports a history of him doing that.  I did refer her to [facility] in the county in [place] she [place] telling her that if he  were able to get on Medicaid and get some additional services including testing he and they would probably benefit.  She is going to have her daughter look into that as her daughter has been in social work most of her life.  The patient acknowledges that she is still grieving.  She says she thinks often about [place]  She and her family at great times gathering their and she and her husband had a great time living there.  She understands that she could not do that anymore physically and is thankful for where she lives now having great neighbors and having her daughter close by.  We talked about allowing herself to grieve those moments but also celebrate the chance that she had to be a part of that for so many years.  The patient does contract for safety having no thoughts of hurting herself or anyone else. Interventions: Cognitive Behavioral Therapy and Grief Therapy  Diagnosis: Complicated grief  Plan: I will meet with the patient every 2 weeks via video session  Treatment plan: We will use elements of cognitive behavioral therapy, dialectical behavior therapy as well as grief therapy to help reduce the patient's grief by at least 50% with a target date of March 23, 2023.  Goals are to have a healthier response to the loss of her husband and her son over a 28-month  period.  Another goal will be for the patient to have less sadness per his self-report as evidenced in therapy notes and allow her through therapy to have less grief and feel less overwhelmed with the losses.  Interventions will be to encourage that telling of the patient's grief story about her husband and son, identify where the patient is and educated her on the different stages of grief, encouraged the patient to show pictures and memorabilia to help process feelings and process any feelings of guilt that may be associated with the losses. Progress: 30% French Ana,  [facility]                                               French Ana, [facility]                    French Ana, [facility]               French Ana, [facility]

## 2023-02-23 ENCOUNTER — Ambulatory Visit: Payer: Medicare Other | Admitting: Behavioral Health

## 2023-02-23 ENCOUNTER — Encounter: Payer: Self-pay | Admitting: Behavioral Health

## 2023-02-23 DIAGNOSIS — F4381 Prolonged grief disorder: Secondary | ICD-10-CM | POA: Diagnosis not present

## 2023-02-23 DIAGNOSIS — F4321 Adjustment disorder with depressed mood: Secondary | ICD-10-CM

## 2023-02-23 NOTE — Progress Notes (Addendum)
 [facility] Counselor/Therapist Progress Note  Patient ID: Courtney Stevenson, MRN: [id number],    Date: 02/23/2023  Time Spent: 55 minutes, 10:04 AM until 10:59 AM.This session was held via video teletherapy. The patient consented to the video teletherapy and was located in her home during this session. She is aware it is the responsibility of the patient to secure confidentiality on her end of the session. The provider was in a private home office for the duration of this session.      Treatment Type: Individual Therapy  Reported Symptoms: Grief  Mental Status Exam: Appearance:  Well Groomed     Behavior: Appropriate  Motor: Normal  Speech/Language:  Normal Rate  Affect: Appropriate  Mood: normal  Thought process: normal  Thought content:   WNL  Sensory/Perceptual disturbances:   WNL  Orientation: oriented to person, place, time/date, situation, day of week, and month of year  Attention: Good  Concentration: Good  Memory: WNL  Fund of knowledge:  Good  Insight:   Good  Judgment:  Good  Impulse Control: Good   Risk Assessment: Danger to Self:  No Self-injurious Behavior: No Danger to Others: No Duty to Warn:no Physical Aggression / Violence:No  Access to Firearms a concern: No  Gang Involvement:No   Subjective: The patient reports some progress in relationship to her son.  She is let her guard down a little bit and letting him come home for a few minutes to visit and for the most part he has not been contradictory.  She says that she still verbally and emotionally keeps him at arms length looking for more consistency in his behavior.  He has held a job now for several months and he has 2 dogs that he is caring for and that is a positive thing.  Her daughter still maintains consistent conversation with him.  The patient does talk to him some on the phone.  There is some anxiety as her son-in-law who is a Emergency planning/management officer is in the [place] area working in describing  some very difficult situations of their.  He will come home in a few days and other officers will go up.  The patient says she realizes sometimes that she is just not as happy as she used to be.  She accurately recognizes [place].  She loved where she was but she loved the fact that so many times family and friends gathered their and that does not happen as frequently as it used to.  She recognizes that she is still grieving her husband and her son.  Recently a neighbor who had some health issues but was doing much better died unexpectedly as result of a stroke.  She knows she is grieving that also.  We processed that grief more today.  She leans on her faith and her family.  She recognizes that there are days she just needs to sit with her grief and other times that she is in a pretty good place keeps reminding herself busy and feels better.   The patient does contract for safety having no thoughts of hurting herself or anyone else. Interventions: Cognitive Behavioral Therapy and Grief Therapy  Diagnosis: Complicated grief  Plan: I will meet with the patient every 2 weeks via video session  Treatment plan: We will use elements of cognitive behavioral therapy, dialectical behavior therapy as well as grief therapy to help reduce the patient's grief by at  least 50% with a target date of March 23, 2023.  Goals are to have a healthier response to the loss of her husband and her son over a 87-month period.  Another goal will be for the patient to have less sadness per his self-report as evidenced in therapy notes and allow her through therapy to have less grief and feel less overwhelmed with the losses.  Interventions will be to encourage that telling of the patient's grief story about her husband and son, identify where the patient is and educated her on the different stages of grief, encouraged the patient to show pictures and  memorabilia to help process feelings and process any feelings of guilt that may be associated with the losses. Progress: 35%  09/07/23: The pt. Scheduled no further appointments after this session. Cecile Coder, [facility]                                               Cecile Coder, [facility]                    Cecile Coder, [facility]               Cecile Coder, [facility]               Cecile Coder, [facility]

## 2023-02-24 DIAGNOSIS — H353231 Exudative age-related macular degeneration, bilateral, with active choroidal neovascularization: Secondary | ICD-10-CM | POA: Diagnosis not present

## 2023-02-24 DIAGNOSIS — H25812 Combined forms of age-related cataract, left eye: Secondary | ICD-10-CM | POA: Diagnosis not present

## 2023-02-24 DIAGNOSIS — H40032 Anatomical narrow angle, left eye: Secondary | ICD-10-CM | POA: Diagnosis not present

## 2023-02-24 DIAGNOSIS — Z961 Presence of intraocular lens: Secondary | ICD-10-CM | POA: Diagnosis not present

## 2023-04-02 ENCOUNTER — Other Ambulatory Visit: Payer: Self-pay | Admitting: Medical-Surgical

## 2023-05-29 ENCOUNTER — Encounter: Payer: Self-pay | Admitting: Medical-Surgical

## 2023-05-30 ENCOUNTER — Encounter: Payer: Self-pay | Admitting: Medical-Surgical

## 2023-05-30 ENCOUNTER — Ambulatory Visit (INDEPENDENT_AMBULATORY_CARE_PROVIDER_SITE_OTHER): Payer: Medicare Other | Admitting: Medical-Surgical

## 2023-05-30 VITALS — BP 154/88 | HR 72 | Resp 20 | Ht <= 58 in | Wt 186.1 lb

## 2023-05-30 DIAGNOSIS — L03012 Cellulitis of left finger: Secondary | ICD-10-CM | POA: Diagnosis not present

## 2023-05-30 MED ORDER — AMOXICILLIN-POT CLAVULANATE 875-125 MG PO TABS
1.0000 | ORAL_TABLET | Freq: Two times a day (BID) | ORAL | 0 refills | Status: DC
Start: 2023-05-30 — End: 2023-06-08

## 2023-05-30 NOTE — Progress Notes (Signed)
        Established patient visit  History, exam, impression, and plan:  1. Cellulitis of left index finger (Primary) Pleasant 83 year old accompanied by her daughter presenting today for swelling, pain, redness, and drainage from the left index finger.  Several days ago, she was using an old wooden back scratcher and she sustained a splinter in the left index finger on the palmar surface at the PIP joint.  She was able to extract most of the splinter but is not sure if a small piece was left in there.  Since then, she has noted that the left index finger has become significantly swollen, tender, warm, and red.  Soaked it in [employer] salt water yesterday which seemed to help a little bit with the discomfort.  When her skin was soft, she admits to picking at the scab and trying to express the drainage.  She was able to get a little bit out but it filled back up today.  On evaluation, she appears to have diffuse swelling and erythema to the left index finger however she has a large fluctuant area proximal to the PIP joint on the palmar surface.  Prior scab intact.  After review of options for treatment, decided to do a bedside I&D followed by Augmentin  twice daily x 10 days.  Okay to do Epsom salt water soaks for comfort if desired.  Recommend washing the site once daily with warm soapy water, rinsing well then pat dry.  Keep covered with a sterile bandage and monitor for worsening of signs and symptoms.  She has an appointment on Monday so this will give us  a chance to do a wound check and make sure she is progressing successfully on Augmentin . - amoxicillin -clavulanate (AUGMENTIN ) 875-125 MG tablet; Take 1 tablet by mouth 2 (two) times daily.  Dispense: 20 tablet; Refill: 0   Procedures performed this visit: I & D  Date/Time: 05/30/2023 1:39 PM  Performed by: Willo Mini, NP Authorized by: Willo Mini, NP   Consent:    Consent obtained:  Verbal   Consent given by:  Patient   Risks, benefits, and  alternatives were discussed: yes     Risks discussed:  Bleeding and pain   Alternatives discussed:  Alternative treatment Location:    Type:  Abscess   Location:  Upper extremity   Upper extremity location:  Finger   Finger location:  L index finger Pre-procedure details:    Skin preparation:  Alcohol Sedation:    Sedation type:  None Anesthesia:    Anesthesia method:  Local infiltration   Local anesthetic:  Lidocaine  1% w/o epi Procedure type:    Complexity:  Simple Procedure details:    Needle aspiration: no     Incision types:  Stab incision   Scalpel blade:  11   Drainage:  Purulent and bloody   Drainage amount:  Moderate   Wound treatment:  Wound left open   Packing materials:  None Post-procedure details:    Procedure completion:  Tolerated well, no immediate complications  Return for Follow-up visit on Monday as scheduled.  __________________________________ Mini FREDRIK Willo, DNP, APRN, FNP-BC Primary Care and Sports Medicine [facility]

## 2023-05-30 NOTE — Telephone Encounter (Signed)
 Patient scheduled.

## 2023-06-05 ENCOUNTER — Encounter: Payer: Self-pay | Admitting: Medical-Surgical

## 2023-06-05 ENCOUNTER — Ambulatory Visit (INDEPENDENT_AMBULATORY_CARE_PROVIDER_SITE_OTHER): Payer: Medicare Other | Admitting: Medical-Surgical

## 2023-06-05 VITALS — BP 146/88 | HR 81 | Resp 20 | Ht <= 58 in | Wt 186.0 lb

## 2023-06-05 DIAGNOSIS — N1832 Chronic kidney disease, stage 3b: Secondary | ICD-10-CM

## 2023-06-05 DIAGNOSIS — I10 Essential (primary) hypertension: Secondary | ICD-10-CM | POA: Diagnosis not present

## 2023-06-05 DIAGNOSIS — I4891 Unspecified atrial fibrillation: Secondary | ICD-10-CM

## 2023-06-05 DIAGNOSIS — E039 Hypothyroidism, unspecified: Secondary | ICD-10-CM

## 2023-06-05 DIAGNOSIS — R600 Localized edema: Secondary | ICD-10-CM

## 2023-06-05 DIAGNOSIS — D509 Iron deficiency anemia, unspecified: Secondary | ICD-10-CM | POA: Diagnosis not present

## 2023-06-05 DIAGNOSIS — L03012 Cellulitis of left finger: Secondary | ICD-10-CM

## 2023-06-05 NOTE — Progress Notes (Signed)
       Established patient visit  History, exam, impression, and plan:  1. Essential hypertension (Primary) Very pleasant 83 year old female accompanied by her daughter presenting today with a history of hypertension that is currently well-managed on carvedilol  1.5 tabs twice daily with meals and furosemide  40 mg daily.  Home readings have been stable and at goal.  Denies concerning symptoms today.  Has been using lymphedema pumps and leg wraps which helped tremendously with lower extremity edema.  Denies shortness of breath, chest pain, headaches, dizziness, vision changes, and palpitations.  HRR, S1/S2 normal.  Due for labs.  Blood pressure elevated today secondary to concern regarding left index finger infection.  Home readings at goal so continue carvedilol  and furosemide  as prescribed. - CMP14+EGFR - CBC with Differential/Platelet  2. Atrial fibrillation, unspecified type (HCC) Doing well on carvedilol .  Well-managed overall with no concerning symptoms.  Continue carvedilol  as prescribed.  3. Stage 3b chronic kidney disease (HCC) Rechecking kidney function today. - CMP14+EGFR  4. Bilateral lower extremity edema As noted above, doing well with the use of lymphedema pumps and leg wraps.  Continue furosemide .  Checking labs as noted.  5. Acquired hypothyroidism Checking TSH. - TSH  6. Cellulitis of left index finger Unfortunately, she does have a recurrence of the fluctuance of her left index finger on the palmar surface.  The finger overall looks better, less swollen, less erythematous, skin peeling.  Given the remnants of fluctuance, discussed options today.  She has 3 to 4 days left of her oral antibiotics.  I have encouraged her to finish these.  Repeat of simple I&D completed at bedside with stab technique.  Moderate bloody discharge with small amount of thick white noted scattered throughout.  If symptoms have not greatly improved after completion of Augmentin , we may need to consider  switching to a different antibiotic yet will provide MRSA coverage.  Advised patient and her daughter to let me know via MyChart message should this occur.   Procedures performed this visit: I & D  Date/Time: 06/05/2023 3:40 PM  Performed by: Willo Mini, NP Authorized by: Willo Mini, NP   Consent:    Consent obtained:  Verbal   Consent given by:  Patient   Risks, benefits, and alternatives were discussed: yes   Location:    Type:  Abscess   Location:  Upper extremity   Upper extremity location:  Finger   Finger location:  L index finger Pre-procedure details:    Skin preparation:  Chlorhexidine Sedation:    Sedation type:  None Anesthesia:    Anesthesia method:  Local infiltration   Local anesthetic:  Lidocaine  1% w/o epi Procedure type:    Complexity:  Simple Procedure details:    Needle aspiration: no     Incision types:  Stab incision   Incision depth:  Dermal   Scalpel blade:  11   Drainage:  Bloody (small thick white discharge noted)   Drainage amount:  Moderate   Wound treatment:  Wound left open   Packing materials:  None Post-procedure details:    Procedure completion:  Tolerated well, no immediate complications  Return in about 6 months (around 12/03/2023) for chronic disease follow up.  __________________________________ Mini FREDRIK Willo, DNP, APRN, FNP-BC Primary Care and Sports Medicine [facility]

## 2023-06-06 ENCOUNTER — Encounter: Payer: Self-pay | Admitting: Medical-Surgical

## 2023-06-06 ENCOUNTER — Other Ambulatory Visit: Payer: Self-pay

## 2023-06-06 DIAGNOSIS — M1711 Unilateral primary osteoarthritis, right knee: Secondary | ICD-10-CM

## 2023-06-06 LAB — CMP14+EGFR
ALT: 10 [IU]/L (ref 0–32)
AST: 17 [IU]/L (ref 0–40)
Albumin: 3.5 g/dL — ABNORMAL LOW (ref 3.7–4.7)
Alkaline Phosphatase: 107 [IU]/L (ref 44–121)
BUN/Creatinine Ratio: 25 (ref 12–28)
BUN: 26 mg/dL (ref 8–27)
Bilirubin Total: 0.3 mg/dL (ref 0.0–1.2)
CO2: 22 mmol/L (ref 20–29)
Calcium: 8.4 mg/dL — ABNORMAL LOW (ref 8.7–10.3)
Chloride: 99 mmol/L (ref 96–106)
Creatinine, Ser: 1.02 mg/dL — ABNORMAL HIGH (ref 0.57–1.00)
Globulin, Total: 2.6 g/dL (ref 1.5–4.5)
Glucose: 95 mg/dL (ref 70–99)
Potassium: 4.5 mmol/L (ref 3.5–5.2)
Sodium: 136 mmol/L (ref 134–144)
Total Protein: 6.1 g/dL (ref 6.0–8.5)
eGFR: 55 mL/min/{1.73_m2} — ABNORMAL LOW (ref >59)

## 2023-06-06 LAB — CBC WITH DIFFERENTIAL/PLATELET
Basophils Absolute: 0.1 10*3/uL (ref 0.0–0.2)
Basos: 1 %
EOS (ABSOLUTE): 0.2 10*3/uL (ref 0.0–0.4)
Eos: 2 %
Hematocrit: 39.6 % (ref 34.0–46.6)
Hemoglobin: 12.4 g/dL (ref 11.1–15.9)
Immature Grans (Abs): 0 10*3/uL (ref 0.0–0.1)
Immature Granulocytes: 0 %
Lymphocytes Absolute: 0.6 10*3/uL — ABNORMAL LOW (ref 0.7–3.1)
Lymphs: 7 %
MCH: 24.3 pg — ABNORMAL LOW (ref 26.6–33.0)
MCHC: 31.3 g/dL — ABNORMAL LOW (ref 31.5–35.7)
MCV: 78 fL — ABNORMAL LOW (ref 79–97)
Monocytes Absolute: 0.8 10*3/uL (ref 0.1–0.9)
Monocytes: 10 %
Neutrophils Absolute: 7 10*3/uL (ref 1.4–7.0)
Neutrophils: 80 %
Platelets: 277 10*3/uL (ref 150–450)
RBC: 5.1 x10E6/uL (ref 3.77–5.28)
RDW: 17.5 % — ABNORMAL HIGH (ref 11.7–15.4)
WBC: 8.7 10*3/uL (ref 3.4–10.8)

## 2023-06-06 LAB — TSH: TSH: 2.56 u[IU]/mL (ref 0.450–4.500)

## 2023-06-07 ENCOUNTER — Encounter: Payer: Self-pay | Admitting: Medical-Surgical

## 2023-06-08 ENCOUNTER — Other Ambulatory Visit: Payer: Self-pay | Admitting: Medical-Surgical

## 2023-06-08 DIAGNOSIS — L03012 Cellulitis of left finger: Secondary | ICD-10-CM

## 2023-06-08 MED ORDER — AMOXICILLIN-POT CLAVULANATE 875-125 MG PO TABS: 1.0000 | ORAL_TABLET | Freq: Two times a day (BID) | ORAL | 0 refills | Status: DC

## 2023-06-08 NOTE — Telephone Encounter (Signed)
Copied from CRM [id number]. Topic: Clinical - Medication Refill >> Jun 08, 2023  1:27 PM Jorje Guild R wrote: Most Recent Primary Care Visit:  Provider: Christen Butter  Department: [facility]  Visit Type: OFFICE VISIT  Date: 06/05/2023  Medication: amoxicillin-clavulanate (AUGMENTIN) 875-125 MG tablet   Has the patient contacted their pharmacy? Yes (Agent: If no, request that the patient contact the pharmacy for the refill. If patient does not wish to contact the pharmacy document the reason why and proceed with request.) (Agent: If yes, when and what did the pharmacy advise?)  Is this the correct pharmacy for this prescription? Yes If no, delete pharmacy and type the correct one.  This is the patient's preferred pharmacy:    [facility] [place], [place] - [address] [place] [address] [place] [address] Phone: [phone] Fax: [phone]   Has the prescription been filled recently? Yes  Is the patient out of the medication? Yes  Has the patient been seen for an appointment in the last year OR does the patient have an upcoming appointment? Yes  Can we respond through MyChart? Yes  Agent: Please be advised that Rx refills may take up to 3 business days. We ask that you follow-up with your pharmacy.

## 2023-06-10 LAB — IRON AND TIBC
Iron Saturation: 10 % — ABNORMAL LOW (ref 15–55)
Iron: 29 ug/dL (ref 27–139)
Total Iron Binding Capacity: 287 ug/dL (ref 250–450)
UIBC: 258 ug/dL (ref 118–369)

## 2023-06-10 LAB — SPECIMEN STATUS REPORT

## 2023-06-10 LAB — FERRITIN: Ferritin: 33 ng/mL (ref 15–150)

## 2023-06-12 ENCOUNTER — Encounter: Payer: Self-pay | Admitting: Medical-Surgical

## 2023-06-21 ENCOUNTER — Ambulatory Visit: Payer: Medicare Other | Admitting: Surgical

## 2023-06-21 ENCOUNTER — Encounter: Payer: Self-pay | Admitting: Surgical

## 2023-06-21 DIAGNOSIS — M1711 Unilateral primary osteoarthritis, right knee: Secondary | ICD-10-CM

## 2023-06-21 MED ORDER — LIDOCAINE HCL 1 % IJ SOLN
5.0000 mL | INTRAMUSCULAR | Status: AC | PRN
  Administered 2023-06-21: 5 mL

## 2023-06-21 MED ORDER — SODIUM HYALURONATE 60 MG/3ML IX PRSY
60.0000 mg | PREFILLED_SYRINGE | INTRA_ARTICULAR | Status: AC | PRN
  Administered 2023-06-21: 60 mg via INTRA_ARTICULAR

## 2023-06-21 NOTE — Progress Notes (Signed)
   Procedure Note  Patient: Courtney Stevenson             Date of Birth: 28-Jan-1941           MRN: [id number]             Visit Date: 06/21/2023  Procedures: Visit Diagnoses:  1. Arthritis of right knee     Large Joint Inj: R knee on 06/21/2023 4:59 PM Indications: diagnostic evaluation, joint swelling and pain Details: 18 G 1.5 in needle, superolateral approach  Arthrogram: No  Medications: 5 mL lidocaine 1 %; 60 mg Sodium Hyaluronate 60 MG/3ML Aspirate: 15 mL Outcome: tolerated well, no immediate complications Procedure, treatment alternatives, risks and benefits explained, specific risks discussed. Consent was given by the patient. Immediately prior to procedure a time out was called to verify the correct patient, procedure, equipment, support staff and site/side marked as required. Patient was prepped and draped in the usual sterile fashion.

## 2023-06-30 DIAGNOSIS — H353231 Exudative age-related macular degeneration, bilateral, with active choroidal neovascularization: Secondary | ICD-10-CM | POA: Diagnosis not present

## 2023-06-30 DIAGNOSIS — Z961 Presence of intraocular lens: Secondary | ICD-10-CM | POA: Diagnosis not present

## 2023-08-01 DIAGNOSIS — L570 Actinic keratosis: Secondary | ICD-10-CM | POA: Diagnosis not present

## 2023-08-01 DIAGNOSIS — Z85828 Personal history of other malignant neoplasm of skin: Secondary | ICD-10-CM | POA: Diagnosis not present

## 2023-08-14 ENCOUNTER — Other Ambulatory Visit: Payer: Self-pay | Admitting: Medical-Surgical

## 2023-08-15 DIAGNOSIS — H903 Sensorineural hearing loss, bilateral: Secondary | ICD-10-CM | POA: Diagnosis not present

## 2023-08-16 ENCOUNTER — Encounter: Payer: Self-pay | Admitting: Medical-Surgical

## 2023-08-16 NOTE — Telephone Encounter (Signed)
 The request is for 10 MG  The medication on the current medication list is for Carvedilol 6.25 MG with the instructions, TAKE 1.5 TABLETS (9.375 MG TOTAL) BY MOUTH 2 (TWO) TIMES DAILY WITH A MEAL.   She has an upcoming appointment on 11/30/2023

## 2023-08-17 ENCOUNTER — Other Ambulatory Visit: Payer: Self-pay | Admitting: Medical-Surgical

## 2023-08-17 MED ORDER — CARVEDILOL 6.25 MG PO TABS: ORAL_TABLET | ORAL | 1 refills | Status: DC

## 2023-09-05 ENCOUNTER — Encounter: Payer: Self-pay | Admitting: Medical-Surgical

## 2023-09-06 MED ORDER — FUROSEMIDE 40 MG PO TABS: 40.0000 mg | ORAL_TABLET | Freq: Every day | ORAL | 1 refills | Status: DC

## 2023-09-30 ENCOUNTER — Other Ambulatory Visit: Payer: Self-pay | Admitting: Medical-Surgical

## 2023-11-11 ENCOUNTER — Other Ambulatory Visit: Payer: Self-pay | Admitting: Medical-Surgical

## 2023-11-13 NOTE — Telephone Encounter (Signed)
 Please advise on refill request

## 2023-11-30 ENCOUNTER — Ambulatory Visit: Payer: Medicare Other | Admitting: Medical-Surgical

## 2023-12-15 ENCOUNTER — Ambulatory Visit (INDEPENDENT_AMBULATORY_CARE_PROVIDER_SITE_OTHER): Admitting: Medical-Surgical

## 2023-12-15 ENCOUNTER — Encounter: Payer: Self-pay | Admitting: Medical-Surgical

## 2023-12-15 VITALS — BP 148/97 | HR 71 | Resp 20 | Ht <= 58 in | Wt 194.0 lb

## 2023-12-15 DIAGNOSIS — E611 Iron deficiency: Secondary | ICD-10-CM

## 2023-12-15 DIAGNOSIS — I4891 Unspecified atrial fibrillation: Secondary | ICD-10-CM

## 2023-12-15 DIAGNOSIS — R600 Localized edema: Secondary | ICD-10-CM

## 2023-12-15 DIAGNOSIS — Z780 Asymptomatic menopausal state: Secondary | ICD-10-CM | POA: Diagnosis not present

## 2023-12-15 DIAGNOSIS — R062 Wheezing: Secondary | ICD-10-CM | POA: Diagnosis not present

## 2023-12-15 DIAGNOSIS — I10 Essential (primary) hypertension: Secondary | ICD-10-CM

## 2023-12-15 DIAGNOSIS — F418 Other specified anxiety disorders: Secondary | ICD-10-CM

## 2023-12-15 DIAGNOSIS — E039 Hypothyroidism, unspecified: Secondary | ICD-10-CM | POA: Diagnosis not present

## 2023-12-15 DIAGNOSIS — N1832 Chronic kidney disease, stage 3b: Secondary | ICD-10-CM | POA: Diagnosis not present

## 2023-12-15 MED ORDER — FUROSEMIDE 40 MG PO TABS: 40.0000 mg | ORAL_TABLET | Freq: Every day | ORAL | 1 refills | Status: AC

## 2023-12-15 MED ORDER — ALBUTEROL SULFATE HFA 108 (90 BASE) MCG/ACT IN AERS: INHALATION_SPRAY | RESPIRATORY_TRACT | 1 refills | Status: DC

## 2023-12-15 MED ORDER — CARVEDILOL 6.25 MG PO TABS: ORAL_TABLET | ORAL | 1 refills | Status: AC

## 2023-12-15 MED ORDER — SERTRALINE HCL 25 MG PO TABS: 25.0000 mg | ORAL_TABLET | Freq: Every day | ORAL | 1 refills | Status: AC

## 2023-12-15 NOTE — Progress Notes (Signed)
 Established patient visit  Discussed the use of AI scribe software for clinical note transcription with the patient, who gave verbal consent to proceed.  History of Present Illness   Courtney Stevenson is an 83 year old female with hypertension and atrial fibrillation who presents for a follow-up visit. She is accompanied by her daughter.  Hypertension - Blood pressure is often elevated during clinic visits, but home measurements are well-controlled at 120s/80s. - Takes carvedilol , one and a half tablets twice daily, with good adherence. - No chest pain, shortness of breath, dizziness, lightheadedness, or syncope.  Atrial fibrillation - Mild with occasional irregular heartbeats. - Well managed with carvedilol . - Not under the care of Cardiology and not interested in a referral.  Lower extremity edema - Swelling in the lower extremities has improved. - Uses leg wraps and receives regular foot care. - No worsening of the swelling.  Respiratory symptoms - Occasional wheezing, managed with an albuterol  inhaler used two to three times a week. - Suspected allergies, especially in hot and humid weather, are a trigger. - No recent episodes of bronchitis since starting the inhaler.  Mood and stress - Mood is managed with sertraline , which is effective without dosage adjustment. - Occasional stress related to family dynamics, particularly concerning her brother.      Physical Exam Vitals reviewed.  Constitutional:      General: She is not in acute distress.    Appearance: Normal appearance. She is obese. She is not ill-appearing.  HENT:     Head: Normocephalic and atraumatic.  Cardiovascular:     Rate and Rhythm: Normal rate and regular rhythm.     Pulses: Normal pulses.     Heart sounds: Normal heart sounds. No murmur heard.    No friction rub. No gallop.  Pulmonary:     Effort: Pulmonary effort is normal. No respiratory distress.     Breath sounds: Normal breath sounds. No  wheezing.  Skin:    General: Skin is warm and dry.  Neurological:     Mental Status: She is alert and oriented to person, place, and time.  Psychiatric:        Mood and Affect: Mood normal.        Behavior: Behavior normal.        Thought Content: Thought content normal.        Judgment: Judgment normal.     Assessment and Plan    Hypertension Blood pressure elevated during visits, likely stress-related, controlled at home with medication. - Continue carvedilol  1.5 tablets twice daily. - Monitor blood pressure at home. - Maintain current medication if blood pressure remains controlled at home.  Atrial Fibrillation Mild atrial fibrillation with controlled rate. - Continue current management as long as rate remains controlled.   Wheezing Occasional wheezing managed with albuterol , possibly allergy-related, exacerbated by heat and humidity. - Continue albuterol  inhaler as needed. - Consider antihistamine like Zyrtec or Claritin if symptoms worsen.  Peripheral Edema Stable lower extremity swelling with leg wraps, noted improvement. - Continue current management with leg wraps.  Depression Mood well-managed with current sertraline  dosage, no adjustment needed. - Continue sertraline  at current dosage.    Iron deficiency - Recheck iron panel.    Hypothyroidism History of elevated TSH, not currently on treatment.  - Checking TSH.  General Health Maintenance Due for bone density scan and routine blood work. - Order bone density scan. - Order routine blood work.      Return in about  6 months (around 06/16/2024) for chronic disease follow up.  __________________________________ Courtney FREDRIK Palin, DNP, APRN, FNP-BC Primary Care and Sports Medicine [facility] [place]

## 2023-12-16 ENCOUNTER — Ambulatory Visit: Payer: Self-pay | Admitting: Medical-Surgical

## 2023-12-16 LAB — CMP14+EGFR
ALT: 10 IU/L (ref 0–32)
AST: 19 IU/L (ref 0–40)
Albumin: 3.6 g/dL — ABNORMAL LOW (ref 3.7–4.7)
Alkaline Phosphatase: 134 IU/L — ABNORMAL HIGH (ref 44–121)
BUN/Creatinine Ratio: 31 — ABNORMAL HIGH (ref 12–28)
BUN: 31 mg/dL — ABNORMAL HIGH (ref 8–27)
Bilirubin Total: 0.2 mg/dL (ref 0.0–1.2)
CO2: 21 mmol/L (ref 20–29)
Calcium: 8.4 mg/dL — ABNORMAL LOW (ref 8.7–10.3)
Chloride: 100 mmol/L (ref 96–106)
Creatinine, Ser: 0.99 mg/dL (ref 0.57–1.00)
Globulin, Total: 2.8 g/dL (ref 1.5–4.5)
Glucose: 102 mg/dL — ABNORMAL HIGH (ref 70–99)
Potassium: 4.2 mmol/L (ref 3.5–5.2)
Sodium: 139 mmol/L (ref 134–144)
Total Protein: 6.4 g/dL (ref 6.0–8.5)
eGFR: 57 mL/min/1.73 — ABNORMAL LOW (ref >59)

## 2023-12-16 LAB — IRON,TIBC AND FERRITIN PANEL
Ferritin: 23 ng/mL (ref 15–150)
Iron Saturation: 7 % — CL (ref 15–55)
Iron: 22 ug/dL — ABNORMAL LOW (ref 27–139)
Total Iron Binding Capacity: 316 ug/dL (ref 250–450)
UIBC: 294 ug/dL (ref 118–369)

## 2023-12-16 LAB — LIPID PANEL
Chol/HDL Ratio: 2.4 ratio (ref 0.0–4.4)
Cholesterol, Total: 107 mg/dL (ref 100–199)
HDL: 45 mg/dL (ref >39)
LDL Chol Calc (NIH): 44 mg/dL (ref 0–99)
Triglycerides: 94 mg/dL (ref 0–149)
VLDL Cholesterol Cal: 18 mg/dL (ref 5–40)

## 2023-12-16 LAB — TSH: TSH: 2.86 u[IU]/mL (ref 0.450–4.500)

## 2023-12-16 LAB — CBC
Hematocrit: 36.9 % (ref 34.0–46.6)
Hemoglobin: 10.9 g/dL — ABNORMAL LOW (ref 11.1–15.9)
MCH: 23.2 pg — ABNORMAL LOW (ref 26.6–33.0)
MCHC: 29.5 g/dL — ABNORMAL LOW (ref 31.5–35.7)
MCV: 79 fL (ref 79–97)
Platelets: 259 x10E3/uL (ref 150–450)
RBC: 4.7 x10E6/uL (ref 3.77–5.28)
RDW: 17.6 % — ABNORMAL HIGH (ref 11.7–15.4)
WBC: 6 x10E3/uL (ref 3.4–10.8)

## 2023-12-18 ENCOUNTER — Telehealth: Payer: Self-pay

## 2023-12-18 NOTE — Telephone Encounter (Signed)
 VOB submitted for Durolane, right knee.

## 2024-01-03 ENCOUNTER — Telehealth: Payer: Self-pay

## 2024-01-03 DIAGNOSIS — M1711 Unilateral primary osteoarthritis, right knee: Secondary | ICD-10-CM

## 2024-01-03 NOTE — Telephone Encounter (Signed)
 Talked with patient and she stated that she will have her daughter to [facility] to schedule for gel injection.  See referrals tab.

## 2024-01-15 DIAGNOSIS — Z961 Presence of intraocular lens: Secondary | ICD-10-CM | POA: Diagnosis not present

## 2024-01-15 DIAGNOSIS — H353231 Exudative age-related macular degeneration, bilateral, with active choroidal neovascularization: Secondary | ICD-10-CM | POA: Diagnosis not present

## 2024-01-15 DIAGNOSIS — H43823 Vitreomacular adhesion, bilateral: Secondary | ICD-10-CM | POA: Diagnosis not present

## 2024-01-15 DIAGNOSIS — H3554 Dystrophies primarily involving the retinal pigment epithelium: Secondary | ICD-10-CM | POA: Diagnosis not present

## 2024-01-24 ENCOUNTER — Ambulatory Visit (INDEPENDENT_AMBULATORY_CARE_PROVIDER_SITE_OTHER): Admitting: Orthopedic Surgery

## 2024-01-24 DIAGNOSIS — M1711 Unilateral primary osteoarthritis, right knee: Secondary | ICD-10-CM

## 2024-01-27 ENCOUNTER — Encounter: Payer: Self-pay | Admitting: Orthopedic Surgery

## 2024-01-27 NOTE — Progress Notes (Unsigned)
   Office Visit Note   Patient: Courtney Stevenson           Date of Birth: Nov 29, 1940           MRN: [id number] Visit Date: 01/24/2024 Requested by: Willo Mini, NP [address] [place],  [place] [address] PCP: Willo Mini, NP  Subjective: Chief Complaint  Patient presents with   Other    Right knee Durolane injection planned    HPI: Courtney Stevenson is a 83 y.o. female who presents to the office reporting ***.                ROS: All systems reviewed are negative as they relate to the chief complaint within the history of present illness.  Patient denies fevers or chills.  Assessment & Plan: Visit Diagnoses:  1. Arthritis of right knee     Plan: ***  Follow-Up Instructions: No follow-ups on file.   Orders:  Orders Placed This Encounter  Procedures   Ambulatory request for injection medication   No orders of the defined types were placed in this encounter.     Procedures: No procedures performed   Clinical Data: No additional findings.  Objective: Vital Signs: There were no vitals taken for this visit.  Physical Exam:  Constitutional: Patient appears well-developed HEENT:  Head: Normocephalic Eyes:EOM are normal Neck: Normal range of motion Cardiovascular: Normal rate Pulmonary/chest: Effort normal Neurologic: Patient is alert Skin: Skin is warm Psychiatric: Patient has normal mood and affect  Ortho Exam: ***  Specialty Comments:  No specialty comments available.  Imaging: No results found.   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   Atrial fibrillation (HCC) 08/25/2020   Essential hypertension 06/23/2020   Hypothyroidism 06/23/2020   Stage 3b chronic kidney disease (HCC) 05/12/2016   Past Medical History:  Diagnosis Date   A-fib (HCC)    Arthritis    Chronic kidney disease    Hypertension     Family History  Problem Relation Age of Onset   Diabetes Mother    Kidney disease Mother    Kidney disease Father    Skin cancer Other     Past Surgical History:  Procedure Laterality Date   TONSILLECTOMY     TOTAL HIP ARTHROPLASTY Left    VAGINAL HYSTERECTOMY     Social History   Occupational History   Occupation: Retired  Tobacco Use   Smoking status: Never   Smokeless tobacco: Never  Vaping Use   Vaping status: Never Used  Substance and Sexual Activity   Alcohol use: Never   Drug use: Never   Sexual activity: Not Currently    Birth control/protection: Post-menopausal, Abstinence

## 2024-01-28 MED ORDER — SODIUM HYALURONATE 60 MG/3ML IX PRSY
60.0000 mg | PREFILLED_SYRINGE | INTRA_ARTICULAR | Status: AC | PRN
  Administered 2024-01-24: 60 mg via INTRA_ARTICULAR

## 2024-01-28 NOTE — Progress Notes (Signed)
   Procedure Note  Patient: Courtney Stevenson             Date of Birth: Dec 25, 1940           MRN: [id number]             Visit Date: 01/24/2024  Procedures: Visit Diagnoses:  1. Arthritis of right knee     Large Joint Inj: R knee on 01/24/2024 4:17 PM Indications: diagnostic evaluation, joint swelling and pain Details: 18 G 1.5 in needle, superolateral approach  Arthrogram: No  Medications: 60 mg Sodium Hyaluronate 60 MG/3ML Outcome: tolerated well, no immediate complications Procedure, treatment alternatives, risks and benefits explained, specific risks discussed. Consent was given by the patient. Immediately prior to procedure a time out was called to verify the correct patient, procedure, equipment, support staff and site/side marked as required. Patient was prepped and draped in the usual sterile fashion.    Lot #[id number] This patient is diagnosed with osteoarthritis of the knee(s).    Radiographs show evidence of joint space narrowing, osteophytes, subchondral sclerosis and/or subchondral cysts.  This patient has knee pain which interferes with functional and activities of daily living.    This patient has experienced inadequate response, adverse effects and/or intolerance with conservative treatments such as acetaminophen , NSAIDS, topical creams, physical therapy or regular exercise, knee bracing and/or weight loss.   This patient has experienced inadequate response or has a contraindication to intra articular steroid injections for at least 3 months.   This patient is not scheduled to have a total knee replacement within 6 months of starting treatment with viscosupplementation.

## 2024-02-01 DIAGNOSIS — Z85828 Personal history of other malignant neoplasm of skin: Secondary | ICD-10-CM | POA: Diagnosis not present

## 2024-02-01 DIAGNOSIS — L820 Inflamed seborrheic keratosis: Secondary | ICD-10-CM | POA: Diagnosis not present

## 2024-02-01 DIAGNOSIS — L570 Actinic keratosis: Secondary | ICD-10-CM | POA: Diagnosis not present

## 2024-02-15 ENCOUNTER — Other Ambulatory Visit: Payer: Self-pay | Admitting: Medical-Surgical

## 2024-03-06 ENCOUNTER — Ambulatory Visit

## 2024-03-06 DIAGNOSIS — M810 Age-related osteoporosis without current pathological fracture: Secondary | ICD-10-CM | POA: Diagnosis not present

## 2024-03-06 DIAGNOSIS — Z780 Asymptomatic menopausal state: Secondary | ICD-10-CM

## 2024-03-06 DIAGNOSIS — Z13820 Encounter for screening for osteoporosis: Secondary | ICD-10-CM

## 2024-03-25 ENCOUNTER — Encounter: Payer: Self-pay | Admitting: Radiology

## 2024-06-08 ENCOUNTER — Other Ambulatory Visit: Payer: Self-pay | Admitting: Medical-Surgical

## 2024-06-12 ENCOUNTER — Ambulatory Visit: Payer: Self-pay

## 2024-06-12 NOTE — Telephone Encounter (Signed)
 FYI Only or Action Required?: FYI only for provider: appointment scheduled on rescheduled follow up to 06/18/24.  Patient was last seen in primary care on 12/15/2023 by Willo Mini, NP.  Called Nurse Triage reporting Leg Swelling.  Symptoms began Chronic.  Interventions attempted: Prescription medications: Lasix  and Rest, hydration, or home remedies.  Symptoms are: unchanged.  Triage Disposition: See PCP Within 2 Weeks  Patient/caregiver understands and will follow disposition?: Yes    Reason for Disposition  [1] MILD swelling of both ankles (i.e., pedal edema) AND [2] is a chronic symptom (recurrent or ongoing AND present > 4 weeks)  Additional Information  Negative: [1] Thigh, calf, or ankle swelling AND [2] bilateral AND [3] 1 side is more swollen    Chronic-on Lasix   Answer Assessment - Initial Assessment Questions Patient's daughter calling back to reschedule after receiving a message that Courtney Stevenson's follow up appointment scheduled 06/17/24 has been cancelled due to projected weather forecast. Daughter would like next available appointment as they have been wanting to discuss Lasix  effectiveness. Courtney Stevenson has chronic bilateral lower extremity swelling, maintained on Lasix , they feel it is not as effective as it had been because the edema is not well controlled, right foot swells more than left at baseline, she also has intermittent weeping. Beginning to effect ambulation. She denies pain, chest pain, shortness of breath, and all other symptoms. Scheduled next available follow up with pcp on 06/18/24, reviewed ED precautions.     1. ONSET: When did the swelling start? (e.g., minutes, hours, days)     chronic 2. LOCATION: What part of the leg is swollen?  Are both legs swollen or just one leg?     Bilateral feet mostly right foot-on lasix   3. SEVERITY: How bad is the swelling? (e.g., localized; mild, moderate, severe)     moderate 4. REDNESS: Is there redness or signs of  infection?     Denies  5. PAIN: Is the swelling painful to touch? If Yes, ask: How painful is it?   (Scale 1-10; mild, moderate or severe)     denies 6. FEVER: Do you have a fever? If Yes, ask: What is it, how was it measured, and when did it start?      Denies  7. CAUSE: What do you think is causing the leg swelling?     Chronic  8. MEDICAL HISTORY: Do you have a history of blood clots (e.g., DVT), cancer, heart failure, kidney disease, or liver failure?      9. RECURRENT SYMPTOM: Have you had leg swelling before? If Yes, ask: When was the last time? What happened that time?     yes 10. OTHER SYMPTOMS: Do you have any other symptoms? (e.g., chest pain, difficulty breathing)       Denies breathing difficulties and chest pain. Mild weeping is not new.  Protocols used: Leg Swelling and Edema-A-AH  Message from [place] [facility] sent at 06/12/2024  3:41 PM EST  Pt daughter calling to resch appointment due to office cancellation however pt has been experiencing an increase of swelling in her legs.

## 2024-06-12 NOTE — Telephone Encounter (Signed)
 The patient has been scheduled on 06/18/24 to address symptoms of edema. Per the daughter, Lasix  rx is ineffective. No available appointments this week. Please advise if patient should be seen sooner than 06/18/24. Thanks in advance.

## 2024-06-13 NOTE — Telephone Encounter (Signed)
 This task has been completed as requested. The patient and her daughter were notified of the recommendations from the provider. Verbalized understanding and is agreeable with plan care.

## 2024-06-17 ENCOUNTER — Ambulatory Visit: Admitting: Medical-Surgical

## 2024-06-18 ENCOUNTER — Ambulatory Visit: Admitting: Medical-Surgical
